# Patient Record
Sex: Male | Born: 1940 | Race: White | Hispanic: No | Marital: Married | Smoking: Never smoker
Health system: Southern US, Community
[De-identification: ages and names within clinical notes are randomized; demographics above are authoritative.]

## PROBLEM LIST (undated history)

## (undated) DIAGNOSIS — K219 Gastro-esophageal reflux disease without esophagitis: Secondary | ICD-10-CM

## (undated) DIAGNOSIS — I10 Essential (primary) hypertension: Secondary | ICD-10-CM

## (undated) DIAGNOSIS — M109 Gout, unspecified: Secondary | ICD-10-CM

## (undated) DIAGNOSIS — I2510 Atherosclerotic heart disease of native coronary artery without angina pectoris: Secondary | ICD-10-CM

## (undated) DIAGNOSIS — I4891 Unspecified atrial fibrillation: Secondary | ICD-10-CM

## (undated) HISTORY — PX: APPENDECTOMY: SHX54

## (undated) HISTORY — PX: CHOLECYSTECTOMY: SHX55

## (undated) HISTORY — DX: Atherosclerotic heart disease of native coronary artery without angina pectoris: I25.10

## (undated) HISTORY — DX: Unspecified atrial fibrillation: I48.91

## (undated) HISTORY — PX: KNEE SURGERY: SHX244

## (undated) HISTORY — PX: HERNIA REPAIR: SHX51

## (undated) HISTORY — PX: FOOT SURGERY: SHX648

---

## 2015-05-30 ENCOUNTER — Emergency Department (HOSPITAL_BASED_OUTPATIENT_CLINIC_OR_DEPARTMENT_OTHER): Payer: Medicare HMO

## 2015-05-30 ENCOUNTER — Inpatient Hospital Stay (HOSPITAL_BASED_OUTPATIENT_CLINIC_OR_DEPARTMENT_OTHER)
Admission: EM | Admit: 2015-05-30 | Discharge: 2015-06-04 | DRG: 813 | Disposition: A | Discharge status: Home Health | Payer: Medicare HMO | Attending: Internal Medicine | Admitting: Internal Medicine

## 2015-05-30 ENCOUNTER — Encounter (HOSPITAL_BASED_OUTPATIENT_CLINIC_OR_DEPARTMENT_OTHER): Payer: Self-pay | Admitting: *Deleted

## 2015-05-30 DIAGNOSIS — B999 Unspecified infectious disease: Secondary | ICD-10-CM

## 2015-05-30 DIAGNOSIS — Z880 Allergy status to penicillin: Secondary | ICD-10-CM

## 2015-05-30 DIAGNOSIS — G587 Mononeuritis multiplex: Secondary | ICD-10-CM

## 2015-05-30 DIAGNOSIS — R1084 Generalized abdominal pain: Secondary | ICD-10-CM

## 2015-05-30 DIAGNOSIS — R21 Rash and other nonspecific skin eruption: Secondary | ICD-10-CM | POA: Diagnosis present

## 2015-05-30 DIAGNOSIS — D72819 Decreased white blood cell count, unspecified: Secondary | ICD-10-CM | POA: Diagnosis present

## 2015-05-30 DIAGNOSIS — R202 Paresthesia of skin: Secondary | ICD-10-CM

## 2015-05-30 DIAGNOSIS — K219 Gastro-esophageal reflux disease without esophagitis: Secondary | ICD-10-CM | POA: Diagnosis present

## 2015-05-30 DIAGNOSIS — R51 Headache: Secondary | ICD-10-CM | POA: Diagnosis present

## 2015-05-30 DIAGNOSIS — I10 Essential (primary) hypertension: Secondary | ICD-10-CM | POA: Diagnosis present

## 2015-05-30 DIAGNOSIS — K56600 Partial intestinal obstruction, unspecified as to cause: Secondary | ICD-10-CM

## 2015-05-30 DIAGNOSIS — G4733 Obstructive sleep apnea (adult) (pediatric): Secondary | ICD-10-CM | POA: Diagnosis present

## 2015-05-30 DIAGNOSIS — I776 Arteritis, unspecified: Secondary | ICD-10-CM | POA: Diagnosis present

## 2015-05-30 DIAGNOSIS — M109 Gout, unspecified: Secondary | ICD-10-CM | POA: Diagnosis present

## 2015-05-30 DIAGNOSIS — R109 Unspecified abdominal pain: Secondary | ICD-10-CM | POA: Diagnosis not present

## 2015-05-30 DIAGNOSIS — Z79891 Long term (current) use of opiate analgesic: Secondary | ICD-10-CM

## 2015-05-30 DIAGNOSIS — D696 Thrombocytopenia, unspecified: Secondary | ICD-10-CM | POA: Diagnosis not present

## 2015-05-30 DIAGNOSIS — K5900 Constipation, unspecified: Secondary | ICD-10-CM | POA: Diagnosis not present

## 2015-05-30 DIAGNOSIS — Z79899 Other long term (current) drug therapy: Secondary | ICD-10-CM

## 2015-05-30 DIAGNOSIS — N210 Calculus in bladder: Secondary | ICD-10-CM | POA: Diagnosis present

## 2015-05-30 DIAGNOSIS — K4031 Unilateral inguinal hernia, with obstruction, without gangrene, recurrent: Secondary | ICD-10-CM | POA: Diagnosis present

## 2015-05-30 DIAGNOSIS — Z9049 Acquired absence of other specified parts of digestive tract: Secondary | ICD-10-CM | POA: Diagnosis present

## 2015-05-30 DIAGNOSIS — K760 Fatty (change of) liver, not elsewhere classified: Secondary | ICD-10-CM | POA: Diagnosis present

## 2015-05-30 DIAGNOSIS — N200 Calculus of kidney: Secondary | ICD-10-CM | POA: Diagnosis present

## 2015-05-30 DIAGNOSIS — E222 Syndrome of inappropriate secretion of antidiuretic hormone: Secondary | ICD-10-CM | POA: Diagnosis present

## 2015-05-30 DIAGNOSIS — R519 Headache, unspecified: Secondary | ICD-10-CM | POA: Insufficient documentation

## 2015-05-30 DIAGNOSIS — A7740 Ehrlichiosis, unspecified: Secondary | ICD-10-CM | POA: Diagnosis present

## 2015-05-30 HISTORY — DX: Essential (primary) hypertension: I10

## 2015-05-30 HISTORY — DX: Gastro-esophageal reflux disease without esophagitis: K21.9

## 2015-05-30 HISTORY — DX: Gout, unspecified: M10.9

## 2015-05-30 LAB — URINALYSIS, ROUTINE W REFLEX MICROSCOPIC
BILIRUBIN URINE: NEGATIVE
Glucose, UA: NEGATIVE mg/dL
HGB URINE DIPSTICK: NEGATIVE
Ketones, ur: NEGATIVE mg/dL
Leukocytes, UA: NEGATIVE
Nitrite: NEGATIVE
PROTEIN: NEGATIVE mg/dL
SPECIFIC GRAVITY, URINE: 1.029 (ref 1.005–1.030)
UROBILINOGEN UA: 1 mg/dL (ref 0.0–1.0)
pH: 5.5 (ref 5.0–8.0)

## 2015-05-30 LAB — COMPREHENSIVE METABOLIC PANEL
ALT: 54 U/L (ref 17–63)
ANION GAP: 9 (ref 5–15)
AST: 70 U/L — ABNORMAL HIGH (ref 15–41)
Albumin: 3.3 g/dL — ABNORMAL LOW (ref 3.5–5.0)
Alkaline Phosphatase: 88 U/L (ref 38–126)
BILIRUBIN TOTAL: 1.2 mg/dL (ref 0.3–1.2)
BUN: 19 mg/dL (ref 6–20)
CHLORIDE: 99 mmol/L — AB (ref 101–111)
CO2: 28 mmol/L (ref 22–32)
Calcium: 9.1 mg/dL (ref 8.9–10.3)
Creatinine, Ser: 1.14 mg/dL (ref 0.61–1.24)
GFR calc Af Amer: >60 mL/min (ref >60)
GLUCOSE: 106 mg/dL — AB (ref 65–99)
Potassium: 3.9 mmol/L (ref 3.5–5.1)
Sodium: 136 mmol/L (ref 135–145)
TOTAL PROTEIN: 6.5 g/dL (ref 6.5–8.1)

## 2015-05-30 LAB — CBC WITH DIFFERENTIAL/PLATELET
Band Neutrophils: 10 % (ref 0–10)
Basophils Absolute: 0 10*3/uL (ref 0.0–0.1)
Basophils Relative: 0 % (ref 0–1)
Eosinophils Absolute: 0 10*3/uL (ref 0.0–0.7)
Eosinophils Relative: 1 % (ref 0–5)
HCT: 43.2 % (ref 39.0–52.0)
Hemoglobin: 14.8 g/dL (ref 13.0–17.0)
LYMPHS ABS: 0.5 10*3/uL — AB (ref 0.7–4.0)
Lymphocytes Relative: 12 % (ref 12–46)
MCH: 30.3 pg (ref 26.0–34.0)
MCHC: 34.3 g/dL (ref 30.0–36.0)
MCV: 88.3 fL (ref 78.0–100.0)
Metamyelocytes Relative: 1 %
Monocytes Absolute: 0.4 10*3/uL (ref 0.1–1.0)
Monocytes Relative: 9 % (ref 3–12)
Myelocytes: 1 %
Neutro Abs: 3 10*3/uL (ref 1.7–7.7)
Neutrophils Relative %: 66 % (ref 43–77)
PLATELETS: 82 10*3/uL — AB (ref 150–400)
RBC: 4.89 MIL/uL (ref 4.22–5.81)
RDW: 13.1 % (ref 11.5–15.5)
WBC: 3.9 10*3/uL — ABNORMAL LOW (ref 4.0–10.5)

## 2015-05-30 LAB — D-DIMER, QUANTITATIVE: D-Dimer, Quant: 12.57 ug/mL-FEU — ABNORMAL HIGH (ref 0.00–0.48)

## 2015-05-30 LAB — LACTATE DEHYDROGENASE: LDH: 304 U/L — ABNORMAL HIGH (ref 98–192)

## 2015-05-30 LAB — I-STAT CHEM 8, ED
BUN: 23 mg/dL — AB (ref 6–20)
CREATININE: 1.1 mg/dL (ref 0.61–1.24)
Calcium, Ion: 1.14 mmol/L (ref 1.13–1.30)
Chloride: 96 mmol/L — ABNORMAL LOW (ref 101–111)
GLUCOSE: 105 mg/dL — AB (ref 65–99)
HEMATOCRIT: 46 % (ref 39.0–52.0)
Hemoglobin: 15.6 g/dL (ref 13.0–17.0)
Potassium: 3.8 mmol/L (ref 3.5–5.1)
Sodium: 136 mmol/L (ref 135–145)
TCO2: 24 mmol/L (ref 0–100)

## 2015-05-30 LAB — LIPASE, BLOOD: Lipase: 13 U/L — ABNORMAL LOW (ref 22–51)

## 2015-05-30 LAB — FIBRINOGEN: FIBRINOGEN: 426 mg/dL (ref 204–475)

## 2015-05-30 LAB — PROTIME-INR
INR: 1.18 (ref 0.00–1.49)
Prothrombin Time: 15.2 seconds (ref 11.6–15.2)

## 2015-05-30 LAB — ACETAMINOPHEN LEVEL

## 2015-05-30 LAB — TROPONIN I: TROPONIN I: 0.03 ng/mL (ref <0.031)

## 2015-05-30 LAB — I-STAT CG4 LACTIC ACID, ED: Lactic Acid, Venous: 1.4 mmol/L (ref 0.5–2.0)

## 2015-05-30 MED ORDER — ACETAMINOPHEN-CODEINE #3 300-30 MG PO TABS
1.0000 | ORAL_TABLET | Freq: Four times a day (QID) | ORAL | Status: DC | PRN
  Administered 2015-05-30 – 2015-06-01 (×7): 1 via ORAL
  Filled 2015-05-30 (×7): qty 1

## 2015-05-30 MED ORDER — IOHEXOL 300 MG/ML  SOLN
25.0000 mL | Freq: Once | INTRAMUSCULAR | Status: AC | PRN
  Administered 2015-05-30: 25 mL via ORAL

## 2015-05-30 MED ORDER — IOHEXOL 300 MG/ML  SOLN
100.0000 mL | Freq: Once | INTRAMUSCULAR | Status: AC | PRN
  Administered 2015-05-30: 100 mL via INTRAVENOUS

## 2015-05-30 MED ORDER — ACETAMINOPHEN 325 MG PO TABS
650.0000 mg | ORAL_TABLET | Freq: Four times a day (QID) | ORAL | Status: DC | PRN
  Administered 2015-05-30 – 2015-05-31 (×5): 650 mg via ORAL
  Filled 2015-05-30 (×5): qty 2

## 2015-05-30 MED ORDER — DOXYCYCLINE HYCLATE 100 MG PO TABS
100.0000 mg | ORAL_TABLET | Freq: Two times a day (BID) | ORAL | Status: DC
  Administered 2015-05-30 – 2015-05-31 (×2): 100 mg via ORAL
  Filled 2015-05-30 (×3): qty 1

## 2015-05-30 MED ORDER — TAMSULOSIN HCL 0.4 MG PO CAPS
0.4000 mg | ORAL_CAPSULE | Freq: Two times a day (BID) | ORAL | Status: DC
  Administered 2015-05-30 – 2015-06-04 (×10): 0.4 mg via ORAL
  Filled 2015-05-30 (×11): qty 1

## 2015-05-30 MED ORDER — SODIUM CHLORIDE 0.9 % IV SOLN: INTRAVENOUS | Status: DC

## 2015-05-30 MED ORDER — PANTOPRAZOLE SODIUM 40 MG PO TBEC
40.0000 mg | DELAYED_RELEASE_TABLET | Freq: Every day | ORAL | Status: DC
  Administered 2015-05-30 – 2015-06-04 (×6): 40 mg via ORAL
  Filled 2015-05-30 (×6): qty 1

## 2015-05-30 MED ORDER — ACETAMINOPHEN-CODEINE #3 300-30 MG PO TABS
1.0000 | ORAL_TABLET | Freq: Once | ORAL | Status: AC
  Administered 2015-05-30: 1 via ORAL
  Filled 2015-05-30: qty 1

## 2015-05-30 MED ORDER — SENNA 8.6 MG PO TABS
2.0000 | ORAL_TABLET | Freq: Every day | ORAL | Status: DC
  Administered 2015-05-30 – 2015-06-02 (×3): 17.2 mg via ORAL
  Filled 2015-05-30 (×3): qty 2

## 2015-05-30 MED ORDER — ACETAMINOPHEN 650 MG RE SUPP: 650.0000 mg | Freq: Four times a day (QID) | RECTAL | Status: DC | PRN

## 2015-05-30 MED ORDER — SODIUM CHLORIDE 0.9 % IV SOLN
INTRAVENOUS | Status: AC
  Administered 2015-05-30: 18:00:00 via INTRAVENOUS

## 2015-05-30 NOTE — ED Provider Notes (Signed)
CSN: [id number]     Arrival date & time 05/30/15  1012 History   First MD Initiated Contact with Patient 05/30/15 1026     Chief Complaint  Patient presents with  . Abdominal Pain     (Consider location/radiation/quality/duration/timing/severity/associated sxs/prior Treatment) HPI Patient presents with multiple complaints. Patient seemingly was well until about one week ago. About the time he developed abdominal pain, changes in bowel movement habits, and intermittent sharp shooting pains per Abdominal pain has been persistent, waxing waning severity. There is associated inconsistent bowel movements, with alternating diarrhea, constipation. The discomfort is largely in the lower abdomen, with associated pressure sensation. No bloody stool. Patient has no dysuria, but has oliguria. No fever. Patient describes persistent occasional sharp shooting pains in an ascending fashion, from his lower extremities superiorly. No clear precipitant for these pains, and during the initial interview he has several episodes. Today the patient also rash, bilateral lower extremities, petechial. Past Medical History  Diagnosis Date  . Hypertension   . Gout   . GERD (gastroesophageal reflux disease)    Past Surgical History  Procedure Laterality Date  . Cholecystectomy    . Appendectomy    . Hernia repair    . Foot surgery    . Knee surgery     No family history on file. History  Substance Use Topics  . Smoking status: Never Smoker   . Smokeless tobacco: Not on file  . Alcohol Use: No    Review of Systems  Constitutional:       Per HPI, otherwise negative  HENT:       Per HPI, otherwise negative  Respiratory:       Per HPI, otherwise negative  Cardiovascular:       Per HPI, otherwise negative  Gastrointestinal: Negative for vomiting.  Endocrine:       Negative aside from HPI  Genitourinary:       Neg aside from HPI   Musculoskeletal:       Per HPI, otherwise negative  Skin:  Positive for rash.  Neurological: Negative for syncope.      Allergies  Penicillins  Home Medications   Prior to Admission medications   Medication Sig Start Date End Date Taking? Authorizing Provider  indomethacin (INDOCIN) 50 MG capsule Take 50 mg by mouth 2 (two) times daily with a meal.   Yes Historical Provider, MD  omeprazole (PRILOSEC) 20 MG capsule Take 20 mg by mouth daily.   Yes Historical Provider, MD  tamsulosin (FLOMAX) 0.4 MG CAPS capsule Take 0.4 mg by mouth.   Yes Historical Provider, MD   BP 149/102 mmHg  Pulse 88  Temp(Src) 98.6 F (37 C) (Oral)  Resp 18  Ht 6\' 1"  (1.854 m)  Wt 212 lb (96.163 kg)  BMI 27.98 kg/m2  SpO2 99% Physical Exam  Constitutional: He is oriented to person, place, and time. He appears well-developed. No distress.  HENT:  Head: Normocephalic and atraumatic.  Eyes: Conjunctivae and EOM are normal.  Cardiovascular: Normal rate and regular rhythm.   Pulmonary/Chest: Effort normal. No stridor. No respiratory distress.  Abdominal: He exhibits no distension.  Diffusely tender abdomen, mild guarding  Just superior to the umbilicus there is a less than 1 cm red area consistent with ingrown hair  Musculoskeletal: He exhibits no edema.  Neurological: He is alert and oriented to person, place, and time. He displays no atrophy and no tremor. No cranial nerve deficit or sensory deficit. He exhibits normal muscle tone. He displays  no seizure activity.  Speech is clear, but somewhat repetitive, tangential.   Skin: Skin is warm and dry. Rash noted.  Bilateral petechial rash about both calves.  Psychiatric: He has a normal mood and affect.  Nursing note and vitals reviewed.   ED Course  Procedures (including critical care time) Labs Review Labs Reviewed  COMPREHENSIVE METABOLIC PANEL - Abnormal; Notable for the following:    Chloride 99 (*)    Glucose, Bld 106 (*)    Albumin 3.3 (*)    AST 70 (*)    All other components within normal  limits  CBC WITH DIFFERENTIAL/PLATELET - Abnormal; Notable for the following:    WBC 3.9 (*)    Platelets 82 (*)    Lymphs Abs 0.5 (*)    All other components within normal limits  URINALYSIS, ROUTINE W REFLEX MICROSCOPIC (NOT AT [facility]) - Abnormal; Notable for the following:    Color, Urine AMBER (*)    APPearance HAZY (*)    All other components within normal limits  D-DIMER, QUANTITATIVE (NOT AT [facility]) - Abnormal; Notable for the following:    D-Dimer, Quant 12.57 (*)    All other components within normal limits  LACTATE DEHYDROGENASE - Abnormal; Notable for the following:    LDH 304 (*)    All other components within normal limits  I-STAT CHEM 8, ED - Abnormal; Notable for the following:    Chloride 96 (*)    BUN 23 (*)    Glucose, Bld 105 (*)    All other components within normal limits  TROPONIN I  PROTIME-INR  FIBRINOGEN  LIPASE, BLOOD  URINE MICROSCOPIC-ADD ON  SAVE SMEAR  I-STAT CG4 LACTIC ACID, ED    Imaging Review Ct Abdomen Pelvis W Contrast  05/30/2015   CLINICAL DATA:  Left lower quadrant pain, fever starting yesterday, status post appendectomy, cholecystectomy, hernia repair.  EXAM: CT ABDOMEN AND PELVIS WITH CONTRAST  TECHNIQUE: Multidetector CT imaging of the abdomen and pelvis was performed using the standard protocol following bolus administration of intravenous contrast.  CONTRAST:  25mL OMNIPAQUE IOHEXOL 300 MG/ML SOLN, OMNIPAQUE IOHEXOL 300 MG/ML SOLN  COMPARISON:  None.  FINDINGS: Lung bases are unremarkable. Atherosclerotic calcifications of abdominal aorta. Degenerative changes of the lumbar spine. There is dextroscoliosis of the lumbar spine.  Fatty infiltration of the liver is noted. Partially fatty replaced atrophic pancreas. The spleen and adrenal glands are unremarkable. Kidneys are symmetrical in size. There is no hydronephrosis or hydroureter. At least 2 nonobstructive calcified calculi are noted within left kidney the largest in lower pole measures  7.7 mm.  Delayed renal images shows bilateral renal symmetrical excretion.  No calcified ureteral calculi are noted. In axial image 72 there is a calcified calculus in right posterior bladder wall measures 4.8 mm. This is probable within bladder or just beyond the right UPJ. No dilatation of distal right ureter. Mild enlarged prostate gland with indentation of urinary bladder base. There is a distended urinary bladder.  Moderate stool noted in right colon and transverse colon. There is minimal distended terminal small bowel with fecal like material see axial image 58. In axial image 72 there is a small right inguinal scrotal canal hernia containing fat and small bowel loop measures 2.3 cm. There is no definite evidence of small bowel obstruction at the level of hernia. However given the fecal like material within terminal small bowel partial small bowel obstruction or incompetent ileocecal valve cannot be excluded. No small bowel air-fluid levels are noted.  There is no transition point in caliber of small bone. Seminal vesicles are unremarkable.  Sigmoid colon diverticula are noted. No evidence of acute colitis or diverticulitis  IMPRESSION: 1. Fatty infiltration of the liver.  Status postcholecystectomy. 2. Atrophic partially fatty replaced pancreas. 3. 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 No hydronephrosis or hydroureter. 4. There is probable calcified calculus in right posterior bladder wall probable just beyond the right UVJ measures 4.8 mm without evidence of ureteral obstruction. 5. There is a small right inguinal scrotal canal hernia containing fat and short segment of small bowel loop. Minimal distended terminal small bowel with fecal like material. Partial small bowel obstruction, ileus or incompetent ileal cecal valve cannot be excluded. Clinical correlation is necessary. No small bowel air-fluid levels. There is no evidence of transition point in caliber of small bowel. 6. Enlarged prostate gland  with indentation of urinary bladder wall. Moderate distended urinary bladder.   Electronically Signed   By: Natasha Mead M.D.   On: 05/30/2015 12:24    Patient's labs notable for from cytopenia, elevated d-dimer, elevated lactate dehydrogenase, all consistent with inappropriate clotting/lysis processes.  CT scan suggestive of bowel obstruction, though the patient has no vomiting, this may be the case.   EKG Interpretation  Date/Time:  Monday May 30 2015 14:34:48 EDT Ventricular Rate:  86 PR Interval:  182 QRS Duration: 102 QT Interval:  362 QTC Calculation: 433 R Axis:   85 Text Interpretation:  Normal sinus rhythm Normal ECG Sinus rhythm Normal ECG Confirmed by Gerhard Munch  MD ([id number]) on 05/30/2015 2:45:55 PM      Attempts to discuss the patient's case with surgery were unsuccessful.  I discussed patient's case with our admitting hospitalist team. Given the patient's lab results consistent with hemolytic process, ongoing headache, pain, he will be transferred, admitted.    Cardiac 85 sinus normal Pulse ox 99% room air normal  Reviewed the patient's labs from other facility, 4 days ago. Patient had a normal head CT, platelets were 100 at that point.    MDM  Patient presents with ongoing abdominal pain, intermittent diffuse episodic pain, change in bowel movements. Patient's description of change from baseline is atypical, and given his new petechial lesions, there was concern for hemolytic process. Patient's labs consistent with this, possibly TTP versus other microangiopathic hemolytic process. Patient received fluid resuscitation here, analgesia here, and after discussion with hospitalist, patient required admission.  Gerhard Munch, MD 05/30/15 [id number]

## 2015-05-30 NOTE — ED Notes (Signed)
Patient transported to CT 

## 2015-05-30 NOTE — ED Notes (Signed)
Notified [facility] of room 1523

## 2015-05-30 NOTE — ED Notes (Signed)
Returned from imaging; NAD.

## 2015-05-30 NOTE — ED Notes (Signed)
PA-C student at bedside. 

## 2015-05-30 NOTE — H&P (Addendum)
History and Physical  CELEDONIO SORTINO ZOX:[id number] DOB: Mar 07, 1941 DOA: 05/30/2015  Referring physician: Dr. Gerhard Munch, EDP PCP: Karle Plumber, MD  Outpatient Specialists:  1. 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 He states that he was in his usual state of health until approximately 1 week ago when he started experiencing mild shocklike/pinprick like discomfort in both legs which was intermittent, lasting a couple seconds each time and recurring every 15 minutes. Over the next day or so, this migrated upwards and eventually localized to his left occipital/periauricular area with moderate to severe pain in that area. This was associated with chills but no fevers. He went to a clinic where temperature was reported as 22 (states that this is fever for him because he usually runs temperature of 45F). They apparently did not find anything significant and patient was given Flexeril for muscle spasms. He took 4 tablets of the Flexeril without significant relief. He then started taking his disabled son's Tylenol No. 3 with good effect. He continued to take this until this morning but continued to have the left sided posterior scalp/headache. He presented to the ED where he was asked to take a deep breath at which time he first noticed some left abdominal discomfort which is present only on palpation or taking deep breaths. He has intermittent diarrhea but has been constipated since yesterday morning and last BM was yesterday morning. Flatus +. Reduced urine output and urine appears dark. This morning while putting on his socks, he noted fine red rash on  his legs. He has not noticed any tick bites. He does not live in a wooded area. 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 Apart from Flexeril and Tylenol No. 3, he has not taken any new medications. He states that in the past he has not been told to have any abnormal blood counts. 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 Patient was sent to [facility] as a direct admit to the hospitalist service.   Review of Systems: All systems reviewed and apart from history of presenting illness, are negative.  Past Medical History  Diagnosis Date  . Hypertension   . Gout   . GERD (gastroesophageal reflux disease)    Past Surgical History  Procedure Laterality Date  . Cholecystectomy    . Appendectomy    . Hernia repair    . Foot surgery    . Knee surgery     Social History:  reports that he has never smoked. He does not have any smokeless tobacco history on file. He reports that he does not drink alcohol or use illicit drugs. Married. Active truck driver. Independent of activities of daily living.  Allergies  Allergen Reactions  . Penicillins Rash    History reviewed. No pertinent family history. patient was repeatedly asked about any significant family history  and he denies.  Prior to Admission medications   Medication Sig Start Date End Date Taking? Authorizing Provider  acetaminophen-codeine (TYLENOL #3) 300-30 MG per tablet Take 1-2 tablets by mouth every 4 (four) hours as needed for moderate pain.   Yes Historical Provider, MD  cyclobenzaprine (FLEXERIL) 10 MG tablet Take 10 mg by mouth. 05/27/15 06/03/15 Yes Historical Provider, MD    indomethacin (INDOCIN) 50 MG capsule Take 50 mg by mouth 2 (two) times daily with a meal.   Yes Historical Provider, MD  omeprazole (PRILOSEC) 20 MG capsule Take 20 mg by mouth daily.   Yes Historical Provider, MD  tamsulosin (FLOMAX) 0.4 MG CAPS capsule Take 0.4 mg by mouth 2 (two) times daily.    Yes Historical Provider, MD   Physical Exam: Filed Vitals:   05/30/15 1022 05/30/15 1355 05/30/15 1511 05/30/15 1626  BP: 149/102 174/97 150/83 130/61  Pulse: 88 93 84 94  Temp: 98.6 F (37 C)     TempSrc: Oral     Resp: 18 18 16 16   Height: 6\' 1"  (1.854 m)     Weight: 96.163 kg (212 lb)     SpO2: 99% 96% 97% 97%     General exam: Moderately built and nourished pleasant elderly male patient, lying comfortably supine on the gurney in no obvious distress.  Head, eyes and ENT: Nontraumatic and normocephalic. Pupils equally reacting to light and accommodation. Oral mucosa slightly dry.  Neck: Supple. No JVD, carotid bruit or thyromegaly.  Lymphatics: No lymphadenopathy.  Respiratory system: Clear to auscultation. No increased work of breathing.  Cardiovascular system: S1 and S2 heard, RRR. No JVD, murmurs, gallops, clicks or pedal edema.  Gastrointestinal system: Abdomen is nondistended, soft. Mild left quadrant tenderness on deep palpation without peritoneal signs. Normal bowel sounds heard. No organomegaly or masses appreciated. Small reddish spot an inch above the umbilicus of unclear etiology.  Central nervous system: Alert and oriented. No focal neurological deficits.  Extremities: Symmetric 5 x 5 power. Peripheral pulses symmetrically felt.   Skin: No rashes or acute findings. Fine petechial rash on symmetric legs mostly below the knees and to a lesser severity on the medial aspect of both thighs.  Musculoskeletal system: Negative exam.  Psychiatry: Pleasant and cooperative.   Labs on Admission:  Basic Metabolic Panel:  Recent Labs Lab 05/30/15 1140 05/30/15 1151  NA  136 136  K 3.9 3.8  CL 99* 96*  CO2 28  --   GLUCOSE 106* 105*  BUN 19 23*  CREATININE 1.14 1.10  CALCIUM 9.1  --    Liver Function Tests:  Recent Labs Lab 05/30/15 1140  AST 70*  ALT 54  ALKPHOS 88  BILITOT 1.2  PROT 6.5  ALBUMIN 3.3*   No results for input(s): LIPASE, AMYLASE in the last 168 hours. No results for input(s): AMMONIA in the last 168 hours. CBC:  Recent Labs Lab 05/30/15 1140 05/30/15 1151  WBC 3.9*  --   NEUTROABS 3.0  --   HGB 14.8 15.6  HCT 43.2 46.0  MCV 88.3  --   PLT 82*  --    Cardiac Enzymes:  Recent Labs Lab 05/30/15 1140  TROPONINI 0.03    BNP (last 3 results) No results for input(s): PROBNP in the last 8760 hours. CBG: No results for input(s): GLUCAP in the last 168 hours.  Radiological Exams on Admission: Ct Abdomen Pelvis W Contrast  05/30/2015   CLINICAL DATA:  Left lower quadrant pain, fever starting yesterday, status post  appendectomy, cholecystectomy, hernia repair.  EXAM: CT ABDOMEN AND PELVIS WITH CONTRAST  TECHNIQUE: Multidetector CT imaging of the abdomen and pelvis was performed using the standard protocol following bolus administration of intravenous contrast.  CONTRAST:  25mL OMNIPAQUE IOHEXOL 300 MG/ML SOLN, OMNIPAQUE IOHEXOL 300 MG/ML SOLN  COMPARISON:  None.  FINDINGS: Lung bases are unremarkable. Atherosclerotic calcifications of abdominal aorta. Degenerative changes of the lumbar spine. There is dextroscoliosis of the lumbar spine.  Fatty infiltration of the liver is noted. Partially fatty replaced atrophic pancreas. The spleen and adrenal glands are unremarkable. Kidneys are symmetrical in size. There is no hydronephrosis or hydroureter. At least 2 nonobstructive calcified calculi are noted within left kidney the largest in lower pole measures 7.7 mm.  Delayed renal images shows bilateral renal symmetrical excretion.  No calcified ureteral calculi are noted. In axial image 72 there is a calcified calculus in right  posterior bladder wall measures 4.8 mm. This is probable within bladder or just beyond the right UPJ. No dilatation of distal right ureter. Mild enlarged prostate gland with indentation of urinary bladder base. There is a distended urinary bladder.  Moderate stool noted in right colon and transverse colon. There is minimal distended terminal small bowel with fecal like material see axial image 58. In axial image 72 there is a small right inguinal scrotal canal hernia containing fat and small bowel loop measures 2.3 cm. There is no definite evidence of small bowel obstruction at the level of hernia. However given the fecal like material within terminal small bowel partial small bowel obstruction or incompetent ileocecal valve cannot be excluded. No small bowel air-fluid levels are noted. There is no transition point in caliber of small bone. Seminal vesicles are unremarkable.  Sigmoid colon diverticula are noted. No evidence of acute colitis or diverticulitis  IMPRESSION: 1. Fatty infiltration of the liver.  Status postcholecystectomy. 2. Atrophic partially fatty replaced pancreas. 3. 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 No hydronephrosis or hydroureter. 4. There is probable calcified calculus in right posterior bladder wall probable just beyond the right UVJ measures 4.8 mm without evidence of ureteral obstruction. 5. There is a small right inguinal scrotal canal hernia containing fat and short segment of small bowel loop. Minimal distended terminal small bowel with fecal like material. Partial small bowel obstruction, ileus or incompetent ileal cecal valve cannot be excluded. Clinical correlation is necessary. No small bowel air-fluid levels. There is no evidence of transition point in caliber of small bowel. 6. Enlarged prostate gland with indentation of urinary bladder wall. Moderate distended urinary bladder.   Electronically Signed   By: Natasha Mead M.D.   On: 05/30/2015 12:24    EKG: Independently  reviewed. Normal sinus rhythm without acute findings. QTC 433 ms.  Assessment/Plan Principal Problem:   Thrombocytopenia Active Problems:   Abdominal pain   Rash and nonspecific skin eruption   Hypertension   Thrombocytopenia with petechial rash - Unclear etiology. No prior lab work to no chronicity of same. As per EDP note, patient had platelet count of 100, 4 days back. - DD-infectious etiology (RMSF, Ehrlichiosis, Lyme's. Note mild leukopenia and mild transaminitis), primary blood disorder, medications versus other etiology. - Discussed with infectious disease M.D. on call: Recommended RMSF and Ehrlichia panel, Lyme Abs, acute hepatitis panel and HIV antibody screen and starting empirically on doxycycline 100 MG bid - Check peripheral smear. - May need hematology consultation in a.m.  Abdominal pain - History is conflicting. EDP note states abdominal pain of a few days'  duration. Patient however indicates that the first time he noted abdominal pain was in the ED when he was asked to take a deep breath - CT findings as above. No acute findings on abdominal exam. - Requested general surgery to evaluate.  OSA - Continue nightly CPAP  GERD - Continue PPI  Hypertension - Patient adamantly denies and states that he only has white coat hypertension. For now monitor.  Leukopenia - Peripheral smear. Follow CBC.  Headache/? Scalp pain - Unclear etiology. No focal deficits. Monitor.    DVT prophylaxis: SCDs Code Status: Full  Family Communication: None at bedside  Disposition Plan: DC home when medically stable.   Time spent: 70 minutes  Dewon Mendizabal, MD, FACP, FHM. [facility] Pager [phone]  If 7PM-7AM, please contact night-coverage [email or web address] Password [email or web address] 05/30/2015, 5:55 PM

## 2015-05-30 NOTE — ED Notes (Signed)
Report given to [facility]. 

## 2015-05-30 NOTE — ED Notes (Signed)
MD at bedside. 

## 2015-05-30 NOTE — ED Notes (Signed)
[facility] at bedside preparing to transport.

## 2015-05-30 NOTE — ED Notes (Signed)
Plan of care discussed with pt and spouse, directions to [facility] provided to spouse.

## 2015-05-30 NOTE — Progress Notes (Addendum)
Pt refused cpap tonight, stating that he has a bad headache.  Per pt, RN is aware of his pain.  Pt was advised that RT is available all night and encouraged him to call should he change his mind.

## 2015-05-30 NOTE — Consult Note (Signed)
General Surgery [facility]  Reason for Consult: abdominal pain, change in bowel habits  Referring Physician: Dr. Algis Liming, [facility]  CLARANCE BOLLARD is an 74 y.o. male.  HPI: patient is a 74 yo WM admitted with multiple complaints.  Patient notes "electrical" type pain which began in feet and moved cephalad, now involving left face and ear.  Patient also notes LLQ abdominal pain upon taking a deep breath.  Patient notes change in bowel habits alternating between diarrhea and constipation.  Last BM yesterday AM.  CT abdomen today shows moderate stool in right colon and fecalization of terminal ileum.  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  No acute findings on CTA.  Past Medical History  Diagnosis Date  . Hypertension   . Gout   . GERD (gastroesophageal reflux disease)     Past Surgical History  Procedure Laterality Date  . Cholecystectomy    . Appendectomy    . Hernia repair    . Foot surgery    . Knee surgery      History reviewed. No pertinent family history.  Social History:  reports that he has never smoked. He does not have any smokeless tobacco history on file. He reports that he does not drink alcohol or use illicit drugs.  Allergies:  Allergies  Allergen Reactions  . Penicillins Rash    Medications: I have reviewed the patient's current medications.  Results for orders placed or performed during the hospital encounter of 05/30/15 (from the past 48 hour(s))  Comprehensive metabolic panel     Status: Abnormal   Collection Time: 05/30/15 11:40 AM  Result Value Ref Range   Sodium 136 135 - 145 mmol/L   Potassium 3.9 3.5 - 5.1 mmol/L   Chloride 99 (L) 101 - 111 mmol/L   CO2 28 22 - 32 mmol/L   Glucose, Bld 106 (H) 65 - 99 mg/dL   BUN 19 6 - 20 mg/dL   Creatinine, Ser 1.14 0.61 - 1.24 mg/dL   Calcium 9.1 8.9 - 10.3 mg/dL   Total Protein 6.5 6.5 - 8.1 g/dL   Albumin 3.3 (L) 3.5 - 5.0 g/dL    AST 70 (H) 15 - 41 U/L   ALT 54 17 - 63 U/L   Alkaline Phosphatase 88 38 - 126 U/L   Total Bilirubin 1.2 0.3 - 1.2 mg/dL   GFR calc non Af Amer >60 >60 mL/min   GFR calc Af Amer >60 >60 mL/min    Comment: (NOTE) The eGFR has been calculated using the CKD EPI equation. This calculation has not been validated in all clinical situations. eGFR's persistently <60 mL/min signify possible Chronic Kidney Disease.    Anion gap 9 5 - 15    Comment: Performed at [facility]  Troponin I     Status: None   Collection Time: 05/30/15 11:40 AM  Result Value Ref Range   Troponin I 0.03 <0.031 ng/mL    Comment:        NO INDICATION OF MYOCARDIAL INJURY.   CBC with Differential     Status: Abnormal   Collection Time: 05/30/15 11:40 AM  Result Value Ref Range   WBC 3.9 (L) 4.0 - 10.5 K/uL   RBC 4.89 4.22 - 5.81 MIL/uL   Hemoglobin 14.8 13.0 - 17.0 g/dL   HCT 43.2 39.0 - 52.0 %   MCV 88.3 78.0 - 100.0 fL   MCH 30.3 26.0 - 34.0 pg  MCHC 34.3 30.0 - 36.0 g/dL   RDW 13.1 11.5 - 15.5 %   Platelets 82 (L) 150 - 400 K/uL    Comment: PLATELET COUNT CONFIRMED BY SMEAR SPECIMEN CHECKED FOR CLOTS PLATELETS APPEAR DECREASED    Neutrophils Relative % 66 43 - 77 %   Lymphocytes Relative 12 12 - 46 %   Monocytes Relative 9 3 - 12 %   Eosinophils Relative 1 0 - 5 %   Basophils Relative 0 0 - 1 %   Band Neutrophils 10 0 - 10 %   Metamyelocytes Relative 1 %   Myelocytes 1 %   Neutro Abs 3.0 1.7 - 7.7 K/uL   Lymphs Abs 0.5 (L) 0.7 - 4.0 K/uL   Monocytes Absolute 0.4 0.1 - 1.0 K/uL   Eosinophils Absolute 0.0 0.0 - 0.7 K/uL   Basophils Absolute 0.0 0.0 - 0.1 K/uL   WBC Morphology ATYPICAL LYMPHOCYTES    Smear Review LARGE PLATELETS PRESENT     Comment: SPECIMEN CHECKED FOR CLOTS GIANT PLATELETS SEEN PLATELET COUNT CONFIRMED BY SMEAR PLATELETS APPEAR DECREASED   Protime-INR     Status: None   Collection Time: 05/30/15 11:40 AM  Result Value Ref Range   Prothrombin Time 15.2 11.6 - 15.2  seconds   INR 1.18 0.00 - 1.49  D-dimer, quantitative     Status: Abnormal   Collection Time: 05/30/15 11:40 AM  Result Value Ref Range   D-Dimer, Quant 12.57 (H) 0.00 - 0.48 ug/mL-FEU    Comment:        AT THE INHOUSE ESTABLISHED CUTOFF VALUE OF 0.48 ug/mL FEU, THIS ASSAY HAS BEEN DOCUMENTED IN THE LITERATURE TO HAVE A SENSITIVITY AND NEGATIVE PREDICTIVE VALUE OF AT LEAST 98 TO 99%.  THE TEST RESULT SHOULD BE CORRELATED WITH AN ASSESSMENT OF THE CLINICAL PROBABILITY OF DVT / VTE.   Fibrinogen     Status: None   Collection Time: 05/30/15 11:40 AM  Result Value Ref Range   Fibrinogen 426 204 - 475 mg/dL  Lactate dehydrogenase     Status: Abnormal   Collection Time: 05/30/15 11:40 AM  Result Value Ref Range   LDH 304 (H) 98 - 192 U/L    Comment: Performed at [facility]  Urinalysis, Routine w reflex microscopic (not at [facility])     Status: Abnormal   Collection Time: 05/30/15 11:45 AM  Result Value Ref Range   Color, Urine AMBER (A) YELLOW    Comment: BIOCHEMICALS MAY BE AFFECTED BY COLOR   APPearance HAZY (A) CLEAR   Specific Gravity, Urine 1.029 1.005 - 1.030   pH 5.5 5.0 - 8.0   Glucose, UA NEGATIVE NEGATIVE mg/dL   Hgb urine dipstick NEGATIVE NEGATIVE   Bilirubin Urine NEGATIVE NEGATIVE   Ketones, ur NEGATIVE NEGATIVE mg/dL   Protein, ur NEGATIVE NEGATIVE mg/dL   Urobilinogen, UA 1.0 0.0 - 1.0 mg/dL   Nitrite NEGATIVE NEGATIVE   Leukocytes, UA NEGATIVE NEGATIVE    Comment: MICROSCOPIC NOT DONE ON URINES WITH NEGATIVE PROTEIN, BLOOD, LEUKOCYTES, NITRITE, OR GLUCOSE <1000 mg/dL.  I-stat Chem 8, ED (when main lab is down)     Status: Abnormal   Collection Time: 05/30/15 11:51 AM  Result Value Ref Range   Sodium 136 135 - 145 mmol/L   Potassium 3.8 3.5 - 5.1 mmol/L   Chloride 96 (L) 101 - 111 mmol/L   BUN 23 (H) 6 - 20 mg/dL   Creatinine, Ser 1.10 0.61 - 1.24 mg/dL   Glucose, Bld 105 (H)  65 - 99 mg/dL   Calcium, Ion 1.14 1.13 - 1.30 mmol/L   TCO2 24 0 - 100  mmol/L   Hemoglobin 15.6 13.0 - 17.0 g/dL   HCT 46.0 39.0 - 52.0 %  I-Stat CG4 Lactic Acid, ED     Status: None   Collection Time: 05/30/15 11:51 AM  Result Value Ref Range   Lactic Acid, Venous 1.40 0.5 - 2.0 mmol/L  Acetaminophen level     Status: Abnormal   Collection Time: 05/30/15  7:40 PM  Result Value Ref Range   Acetaminophen (Tylenol), Serum <10 (L) 10 - 30 ug/mL    Comment:        THERAPEUTIC CONCENTRATIONS VARY SIGNIFICANTLY. A RANGE OF 10-30 ug/mL MAY BE AN EFFECTIVE CONCENTRATION FOR MANY PATIENTS. HOWEVER, SOME ARE BEST TREATED AT CONCENTRATIONS OUTSIDE THIS RANGE. ACETAMINOPHEN CONCENTRATIONS >150 ug/mL AT 4 HOURS AFTER INGESTION AND >50 ug/mL AT 12 HOURS AFTER INGESTION ARE OFTEN ASSOCIATED WITH TOXIC REACTIONS.     Ct Abdomen Pelvis W Contrast  05/30/2015   CLINICAL DATA:  Left lower quadrant pain, fever starting yesterday, status post appendectomy, cholecystectomy, hernia repair.  EXAM: CT ABDOMEN AND PELVIS WITH CONTRAST  TECHNIQUE: Multidetector CT imaging of the abdomen and pelvis was performed using the standard protocol following bolus administration of intravenous contrast.  CONTRAST:  104mL OMNIPAQUE IOHEXOL 300 MG/ML SOLN, 127mL OMNIPAQUE IOHEXOL 300 MG/ML SOLN  COMPARISON:  None.  FINDINGS: Lung bases are unremarkable. Atherosclerotic calcifications of abdominal aorta. Degenerative changes of the lumbar spine. There is dextroscoliosis of the lumbar spine.  Fatty infiltration of the liver is noted. Partially fatty replaced atrophic pancreas. The spleen and adrenal glands are unremarkable. Kidneys are symmetrical in size. There is no hydronephrosis or hydroureter. At least 2 nonobstructive calcified calculi are noted within left kidney the largest in lower pole measures 7.7 mm.  Delayed renal images shows bilateral renal symmetrical excretion.  No calcified ureteral calculi are noted. In axial image 72 there is a calcified calculus in right posterior bladder wall  measures 4.8 mm. This is probable within bladder or just beyond the right UPJ. No dilatation of distal right ureter. Mild enlarged prostate gland with indentation of urinary bladder base. There is a distended urinary bladder.  Moderate stool noted in right colon and transverse colon. There is minimal distended terminal small bowel with fecal like material see axial image 58. In axial image 72 there is a small right inguinal scrotal canal hernia containing fat and small bowel loop measures 2.3 cm. There is no definite evidence of small bowel obstruction at the level of hernia. However given the fecal like material within terminal small bowel partial small bowel obstruction or incompetent ileocecal valve cannot be excluded. No small bowel air-fluid levels are noted. There is no transition point in caliber of small bone. Seminal vesicles are unremarkable.  Sigmoid colon diverticula are noted. No evidence of acute colitis or diverticulitis  IMPRESSION: 1. Fatty infiltration of the liver.  Status postcholecystectomy. 2. Atrophic partially fatty replaced pancreas. 3. 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 No hydronephrosis or hydroureter. 4. There is probable calcified calculus in right posterior bladder wall probable just beyond the right UVJ measures 4.8 mm without evidence of ureteral obstruction. 5. There is a small right inguinal scrotal canal hernia containing fat and short segment of small bowel loop. Minimal distended terminal small bowel with fecal like material. Partial small bowel obstruction, ileus or incompetent ileal cecal valve cannot be excluded. Clinical correlation is necessary. No small  bowel air-fluid levels. There is no evidence of transition point in caliber of small bowel. 6. Enlarged prostate gland with indentation of urinary bladder wall. Moderate distended urinary bladder.   Electronically Signed   By: Lahoma Crocker M.D.   On: 05/30/2015 12:24    Review of Systems  Constitutional:  Negative.   HENT: Positive for ear pain.   Eyes: Negative.   Respiratory:       Pain with deep breath  Cardiovascular: Negative.   Gastrointestinal: Positive for abdominal pain (left lower quadrant), diarrhea and constipation.  Genitourinary: Negative.   Musculoskeletal: Negative.   Skin: Positive for rash.  Neurological: Positive for tingling.  Endo/Heme/Allergies: Negative.   Psychiatric/Behavioral: Negative.    Blood pressure 130/61, pulse 94, temperature 98.6 F (37 C), temperature source Oral, resp. rate 16, height $RemoveBe'6\' 1"'[id number]$  (1.854 m), weight 96.163 kg (212 lb), SpO2 97 %. Physical Exam  Constitutional: He is oriented to person, place, and time. He appears well-developed and well-nourished. No distress.  HENT:  Head: Normocephalic and atraumatic.  Right Ear: External ear normal.  Left Ear: External ear normal.  Dentition poor  Eyes: Conjunctivae are normal. Pupils are equal, round, and reactive to light. No scleral icterus.  Neck: Normal range of motion. Neck supple. No tracheal deviation present. No thyromegaly present.  Cardiovascular: Normal rate, regular rhythm and normal heart sounds.   Respiratory: Effort normal and breath sounds normal. No respiratory distress. He has no wheezes.  GI: Soft. Bowel sounds are normal. He exhibits no distension and no mass. There is no tenderness. There is no rebound and no guarding.  Genitourinary: Penis normal.  Small right inguinal hernia, spontaneously reducible; well-healed bilateral inguinal hernia repair incisions  Musculoskeletal: Normal range of motion. He exhibits no edema or tenderness.  Lymphadenopathy:    He has no cervical adenopathy.  Neurological: He is alert and oriented to person, place, and time.  Skin: Skin is warm and dry. He is not diaphoretic.  Psychiatric: He has a normal mood and affect. His behavior is normal.    Assessment/Plan: 1. Abdominal pain, LLQ, of unknown etiology 2. Partial SBO versus constipation 3.  Recurrent RIH  Patient is seen and examined.  CT scan also reviewed and discussed with patient.  No evidence of acute intra-abdominal process.  Medical evaluation ongoing.  Will follow with you.  Plan to obtain AXR in AM to check progress of CT contrast.  Earnstine Regal, MD, [facility] Office: [place] 05/30/2015, 9:05 PM

## 2015-05-30 NOTE — ED Notes (Signed)
PA-C student at bedside discussing plan of care and admission.

## 2015-05-30 NOTE — ED Notes (Signed)
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 Pt sts his temp usually runs 96 degrees so a temp of 98 is high for him. Pt sts he is also feeling a pain in his LLQ. Pt was seen at [facility] and got an rx for flexeril but no antibiotics.

## 2015-05-30 NOTE — ED Notes (Signed)
Urinal provided and pt is drinking PO contrast.

## 2015-05-30 NOTE — Progress Notes (Signed)
Mr Leroy Morrison, Leroy Morrison (dob [birth year]) presented to [facility] due to abdominal pain, new petechial rash bilateral lower extremities. Labs showed thrombocytopenia, elevated ldh, ct ab ? Partial SBO, patient denies n/v. Patient is hemodynamically stable. He was seen at [facility] on 7/29 due to muscle aches and soreness was given muscle relaxer.  Broad differential including viral illness, tick born disease, hematological /rheumatological process, likely will need hematology/genernal surgery to biopsy rash. Patient accepted to med surg at [facility].

## 2015-05-30 NOTE — ED Notes (Signed)
Report called to Darl Pikes, RN [facility]

## 2015-05-31 ENCOUNTER — Observation Stay (HOSPITAL_COMMUNITY): Payer: Medicare HMO

## 2015-05-31 ENCOUNTER — Encounter (HOSPITAL_COMMUNITY): Payer: Self-pay | Admitting: Radiology

## 2015-05-31 DIAGNOSIS — R519 Headache, unspecified: Secondary | ICD-10-CM | POA: Insufficient documentation

## 2015-05-31 DIAGNOSIS — K5900 Constipation, unspecified: Secondary | ICD-10-CM

## 2015-05-31 DIAGNOSIS — R109 Unspecified abdominal pain: Secondary | ICD-10-CM | POA: Diagnosis not present

## 2015-05-31 DIAGNOSIS — D696 Thrombocytopenia, unspecified: Secondary | ICD-10-CM | POA: Diagnosis not present

## 2015-05-31 DIAGNOSIS — R21 Rash and other nonspecific skin eruption: Secondary | ICD-10-CM | POA: Diagnosis not present

## 2015-05-31 DIAGNOSIS — D72819 Decreased white blood cell count, unspecified: Secondary | ICD-10-CM | POA: Diagnosis not present

## 2015-05-31 DIAGNOSIS — R51 Headache: Secondary | ICD-10-CM | POA: Diagnosis not present

## 2015-05-31 DIAGNOSIS — M792 Neuralgia and neuritis, unspecified: Secondary | ICD-10-CM

## 2015-05-31 DIAGNOSIS — R5383 Other fatigue: Secondary | ICD-10-CM

## 2015-05-31 DIAGNOSIS — R509 Fever, unspecified: Secondary | ICD-10-CM | POA: Diagnosis not present

## 2015-05-31 LAB — CK: Total CK: 77 U/L (ref 49–397)

## 2015-05-31 LAB — COMPREHENSIVE METABOLIC PANEL
ALBUMIN: 3.2 g/dL — AB (ref 3.5–5.0)
ALK PHOS: 90 U/L (ref 38–126)
ALT: 53 U/L (ref 17–63)
AST: 55 U/L — AB (ref 15–41)
Anion gap: 8 (ref 5–15)
BILIRUBIN TOTAL: 1.4 mg/dL — AB (ref 0.3–1.2)
BUN: 19 mg/dL (ref 6–20)
CHLORIDE: 102 mmol/L (ref 101–111)
CO2: 28 mmol/L (ref 22–32)
CREATININE: 1.26 mg/dL — AB (ref 0.61–1.24)
Calcium: 8.6 mg/dL — ABNORMAL LOW (ref 8.9–10.3)
GFR calc non Af Amer: 54 mL/min — ABNORMAL LOW (ref >60)
Glucose, Bld: 101 mg/dL — ABNORMAL HIGH (ref 65–99)
Potassium: 4.3 mmol/L (ref 3.5–5.1)
Sodium: 138 mmol/L (ref 135–145)
TOTAL PROTEIN: 6.1 g/dL — AB (ref 6.5–8.1)

## 2015-05-31 LAB — DIFFERENTIAL
BASOS ABS: 0 10*3/uL (ref 0.0–0.1)
Band Neutrophils: 0 % (ref 0–10)
Basophils Relative: 1 % (ref 0–1)
Blasts: 0 %
EOS PCT: 3 % (ref 0–5)
Eosinophils Absolute: 0.1 10*3/uL (ref 0.0–0.7)
Lymphocytes Relative: 23 % (ref 12–46)
Lymphs Abs: 1.1 10*3/uL (ref 0.7–4.0)
METAMYELOCYTES PCT: 0 %
MONOS PCT: 13 % — AB (ref 3–12)
MYELOCYTES: 0 %
Monocytes Absolute: 0.6 10*3/uL (ref 0.1–1.0)
NEUTROS ABS: 2.8 10*3/uL (ref 1.7–7.7)
NEUTROS PCT: 60 % (ref 43–77)
NRBC: 0 /100{WBCs}
OTHER: 0 %
Promyelocytes Absolute: 0 %

## 2015-05-31 LAB — C-REACTIVE PROTEIN: CRP: 18.1 mg/dL — AB (ref <1.0)

## 2015-05-31 LAB — VITAMIN B12: Vitamin B-12: 628 pg/mL (ref 180–914)

## 2015-05-31 LAB — PATHOLOGIST SMEAR REVIEW

## 2015-05-31 LAB — CBC
HEMATOCRIT: 38.3 % — AB (ref 39.0–52.0)
HEMOGLOBIN: 12.7 g/dL — AB (ref 13.0–17.0)
MCH: 29.3 pg (ref 26.0–34.0)
MCHC: 33.2 g/dL (ref 30.0–36.0)
MCV: 88.5 fL (ref 78.0–100.0)
Platelets: 77 10*3/uL — ABNORMAL LOW (ref 150–400)
RBC: 4.33 MIL/uL (ref 4.22–5.81)
RDW: 13 % (ref 11.5–15.5)
WBC: 4.6 10*3/uL (ref 4.0–10.5)

## 2015-05-31 LAB — SEDIMENTATION RATE: Sed Rate: 31 mm/hr — ABNORMAL HIGH (ref 0–16)

## 2015-05-31 LAB — URIC ACID: Uric Acid, Serum: 8.4 mg/dL — ABNORMAL HIGH (ref 4.4–7.6)

## 2015-05-31 MED ORDER — DOXYCYCLINE HYCLATE 100 MG IV SOLR
100.0000 mg | Freq: Two times a day (BID) | INTRAVENOUS | Status: DC
Start: 2015-05-31 — End: 2015-06-04
  Administered 2015-05-31 – 2015-06-04 (×8): 100 mg via INTRAVENOUS
  Filled 2015-05-31 (×9): qty 100

## 2015-05-31 MED ORDER — GABAPENTIN 300 MG PO CAPS
300.0000 mg | ORAL_CAPSULE | Freq: Three times a day (TID) | ORAL | Status: DC
  Administered 2015-05-31 – 2015-06-01 (×3): 300 mg via ORAL
  Filled 2015-05-31 (×6): qty 1

## 2015-05-31 MED ORDER — POLYETHYLENE GLYCOL 3350 17 G PO PACK
17.0000 g | PACK | Freq: Every day | ORAL | Status: AC
  Filled 2015-05-31: qty 1

## 2015-05-31 MED ORDER — FLEET ENEMA 7-19 GM/118ML RE ENEM
1.0000 | ENEMA | Freq: Once | RECTAL | Status: AC
  Administered 2015-05-31: 1 via RECTAL
  Filled 2015-05-31: qty 1

## 2015-05-31 MED ORDER — SODIUM CHLORIDE 0.9 % IV SOLN
INTRAVENOUS | Status: AC
  Administered 2015-05-31 – 2015-06-01 (×2): via INTRAVENOUS

## 2015-05-31 NOTE — Progress Notes (Addendum)
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 Patient was sent to [facility] as a direct admit to the hospitalist service. Surgery evaluated and signed off-no acute abdominal issues. ID consulted for evaluation of infectious etiology as cause for his presentation.   Assessment/Plan:  Thrombocytopenia with petechial rash - Unclear etiology. No prior lab work to know chronicity of same. As per EDP note, patient had platelet count of 100, 4 days pta. - DD-infectious etiology (RMSF, Ehrlichiosis, Lyme's. Note mild leukopenia and mild transaminitis), primary blood disorder, medications versus other etiology. - Discussed with infectious disease M.D. on call on 8/1: Recommended RMSF and Ehrlichia panel, Lyme Abs, acute hepatitis panel and HIV antibody screen and starting empirically on doxycycline 100 MG bid. All labs drawn and pending - Check peripheral smear- pending. - Platelets have dropped from 82 on admission to 27. Follow CBC in a.m. Elevated d-dimer (12.57)-probably an acute phase reactant in the absence of respiratory symptoms or chest pain. Fibrinogen 426. - Creatinine has mildly bumped up.? Related to  IV contrast. Brief IV fluids. Follow BMP in a.m. - Requested ID input on 8/2.  Abdominal pain - History is conflicting. EDP note states abdominal pain of a few days' duration. Patient however indicates that the first time he noted abdominal pain was in the ED when he was asked to take a deep breath - CT findings as above. No acute findings on abdominal exam. - Surgery input appreciated-no acute surgical needs. Recommend bowel regimen for constipation and outpatient follow-up regarding recurrent right inguinal hernia.  OSA - Continue nightly CPAP  GERD - Continue PPI  Hypertension - Patient adamantly denies and states that he only has white coat hypertension. For now monitor.  Leukopenia - Resolved  Headache/? Scalp pain - Unclear etiology. No focal deficits. Localized headache around left year/left occipital scalp. No acute findings on otoscopic exam bilaterally. - CT head: No acute findings - No meningismus. Supportive treatment.  Constipation - Bowel regimen.   DVT prophylaxis: SCDs Code Status: Full  Family Communication: None at bedside  Disposition Plan: DC home when medically stable.    Consultants:  None  Procedures:  None  Antibiotics:  PO Doxycycline 8/1 >  Subjective: Continues to complain of intermittent pain at left occipital scalp & around left ear.  Objective: Filed Vitals:   05/30/15 1626 05/30/15 2152 05/31/15 0509 05/31/15 1336  BP: 130/61 119/76 126/75 146/89  Pulse: 94 88 90 88  Temp:  98.5 F (36.9 C) 98 F (36.7 C) 98.4 F (36.9 C)  TempSrc:  Oral Oral Oral  Resp: 16 18 20 18   Height:      Weight:      SpO2: 97% 97% 100% 98%    Intake/Output Summary (Last 24 hours) at 05/31/15 1517 Last  data filed at 05/31/15 1337  Gross per 24 hour  Intake 835.83 ml  Output   2675 ml  Net -1839.17 ml   Filed Weights   05/30/15 1022  Weight: 96.163 kg (212 lb)     Exam:  General exam: Pleasant elderly male sitting up comfortably  in bed. Not septic or toxic looking. No neck stiffness. Neck supple. Respiratory system: Clear. No increased work of breathing. Cardiovascular system: S1 & S2 heard, RRR. No JVD, murmurs, gallops, clicks or pedal edema. Gastrointestinal system: Abdomen is nondistended, soft and nontender. Normal bowel sounds heard. Central nervous system: Alert and oriented. No focal neurological deficits. Extremities: Symmetric 5 x 5 power. Skin: Fine petechial rash on symmetric legs mostly below the knees and to a lesser severity on the medial aspect of both thighs.      Data Reviewed: Basic Metabolic Panel:  Recent Labs Lab 05/30/15 1140 05/30/15 1151 05/31/15 0440  NA 136 136 138  K 3.9 3.8 4.3  CL 99* 96* 102  CO2 28  --  28  GLUCOSE 106* 105* 101*  BUN 19 23* 19  CREATININE 1.14 1.10 1.26*  CALCIUM 9.1  --  8.6*   Liver Function Tests:  Recent Labs Lab 05/30/15 1140 05/31/15 0440  AST 70* 55*  ALT 54 53  ALKPHOS 88 90  BILITOT 1.2 1.4*  PROT 6.5 6.1*  ALBUMIN 3.3* 3.2*    Recent Labs Lab 05/30/15 1140  LIPASE 13*   No results for input(s): AMMONIA in the last 168 hours. CBC:  Recent Labs Lab 05/30/15 1140 05/30/15 1151 05/31/15 0440  WBC 3.9*  --  4.6  NEUTROABS 3.0  --   --   HGB 14.8 15.6 12.7*  HCT 43.2 46.0 38.3*  MCV 88.3  --  88.5  PLT 82*  --  77*   Cardiac Enzymes:  Recent Labs Lab 05/30/15 1140  TROPONINI 0.03   BNP (last 3 results) No results for input(s): PROBNP in the last 8760 hours. CBG: No results for input(s): GLUCAP in the last 168 hours.  No results found for this or any previous visit (from the past 240 hour(s)).       Studies: Ct Head Wo Contrast  05/31/2015   CLINICAL DATA:  Headaches for 5 days.  The personal history of CVA.  EXAM: CT HEAD WITHOUT CONTRAST  TECHNIQUE: Contiguous axial images were obtained from the base of the skull through the vertex without intravenous contrast.  COMPARISON:  Report of MRI brain 01/02/2013.   FINDINGS: Mild generalized atrophy and white matter disease is noted. This was previously reported. A remote lacunar infarct is again noted within the left thalamus. No acute cortical infarct, hemorrhage, or mass lesion is present. The basal ganglia are otherwise intact. The insular ribbon is intact.  Remote sinus surgery is noted. Mild mucosal thickening is present within residual left ethmoid air cells. The remaining paranasal sinuses and mastoid air cells are clear. Atherosclerotic calcifications are present in the cavernous internal carotid arteries and at the dural margin of the vertebral arteries.  No significant extracranial soft tissue abnormality is present. The globes and orbits are intact.  IMPRESSION: 1. No acute intracranial abnormality or significant interval change. 2. Mild atrophy and white matter disease likely reflects the sequela of chronic microvascular ischemia. 3. Remote lacunar infarct of the left thalamus.   Electronically Signed   By: Marin Roberts M.D.   On: 05/31/2015 12:03   Ct Abdomen Pelvis W Contrast  05/30/2015  CLINICAL DATA:  Left lower quadrant pain, fever starting yesterday, status post appendectomy, cholecystectomy, hernia repair.  EXAM: CT ABDOMEN AND PELVIS WITH CONTRAST  TECHNIQUE: Multidetector CT imaging of the abdomen and pelvis was performed using the standard protocol following bolus administration of intravenous contrast.  CONTRAST:  25mL OMNIPAQUE IOHEXOL 300 MG/ML SOLN, OMNIPAQUE IOHEXOL 300 MG/ML SOLN  COMPARISON:  None.  FINDINGS: Lung bases are unremarkable. Atherosclerotic calcifications of abdominal aorta. Degenerative changes of the lumbar spine. There is dextroscoliosis of the lumbar spine.  Fatty infiltration of the liver is noted. Partially fatty replaced atrophic pancreas. The spleen and adrenal glands are unremarkable. Kidneys are symmetrical in size. There is no hydronephrosis or hydroureter. At least 2 nonobstructive calcified calculi are  noted within left kidney the largest in lower pole measures 7.7 mm.  Delayed renal images shows bilateral renal symmetrical excretion.  No calcified ureteral calculi are noted. In axial image 72 there is a calcified calculus in right posterior bladder wall measures 4.8 mm. This is probable within bladder or just beyond the right UPJ. No dilatation of distal right ureter. Mild enlarged prostate gland with indentation of urinary bladder base. There is a distended urinary bladder.  Moderate stool noted in right colon and transverse colon. There is minimal distended terminal small bowel with fecal like material see axial image 58. In axial image 72 there is a small right inguinal scrotal canal hernia containing fat and small bowel loop measures 2.3 cm. There is no definite evidence of small bowel obstruction at the level of hernia. However given the fecal like material within terminal small bowel partial small bowel obstruction or incompetent ileocecal valve cannot be excluded. No small bowel air-fluid levels are noted. There is no transition point in caliber of small bone. Seminal vesicles are unremarkable.  Sigmoid colon diverticula are noted. No evidence of acute colitis or diverticulitis  IMPRESSION: 1. Fatty infiltration of the liver.  Status postcholecystectomy. 2. Atrophic partially fatty replaced pancreas. 3. 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 No hydronephrosis or hydroureter. 4. There is probable calcified calculus in right posterior bladder wall probable just beyond the right UVJ measures 4.8 mm without evidence of ureteral obstruction. 5. There is a small right inguinal scrotal canal hernia containing fat and short segment of small bowel loop. Minimal distended terminal small bowel with fecal like material. Partial small bowel obstruction, ileus or incompetent ileal cecal valve cannot be excluded. Clinical correlation is necessary. No small bowel air-fluid levels. There is no evidence of transition  point in caliber of small bowel. 6. Enlarged prostate gland with indentation of urinary bladder wall. Moderate distended urinary bladder.   Electronically Signed   By: Natasha Mead M.D.   On: 05/30/2015 12:24   Dg Abd 2 Views  05/31/2015   CLINICAL DATA:  Suspect partial small bowel obstruction due to abdominal distention and fever  EXAM: ABDOMEN - 2 VIEW  COMPARISON:  Abdominal pelvic CT scan of May 30, 2015  FINDINGS: The colonic stool burden is increased diffusely. There is contrast in the right colon from yesterday's CT scan. There are a few loops of mildly distended air-filled small bowel in the mid abdomen. There are no free extraluminal gas collections. There are surgical clips in the gallbladder fossa and to the left of L4. There are degenerative changes of the lumbar spine.  IMPRESSION: There is no high-grade bowel obstruction nor evidence of perforation. A few loops of air and fluid-filled small bowel are present in the mid abdomen. Increase  colonic stool burden suggests clinical constipation.   Electronically Signed   By: David  [place] M.D.   On: 05/31/2015 07:33        Scheduled Meds: . doxycycline  100 mg Oral BID  . pantoprazole  40 mg Oral Daily  . polyethylene glycol  17 g Oral Daily  . senna  2 tablet Oral QHS  . tamsulosin  0.4 mg Oral BID   Continuous Infusions:   Principal Problem:   Thrombocytopenia Active Problems:   Abdominal pain   Rash and nonspecific skin eruption   Hypertension   Small bowel obstruction, partial    Time spent: 40 minutes    HONGALGI,ANAND, MD, FACP, FHM. [facility] Pager [phone]  If 7PM-7AM, please contact night-coverage [email or web address] Password [email or web address] 05/31/2015, 3:17 PM    LOS: 1 day

## 2015-05-31 NOTE — Consult Note (Addendum)
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 He reports noticing his health being subpar, roughly 8 days ago. He had new onset of joint pain, and feet pain as well as left post-auricular pain that he describes as sharp, shooting type of pain. He took tylenol 3 for relief. He also noticed having fatigue and weakness where he was unable to pull himself out of a chair, which is unlike him. He noted his temp being above his baseline (39F) running 99-100. The day prior to admit, he had bilateral lower extremity rash that spread up his legs. He sought medical care at this time. In the ED, he was found to have possible tick bite with surrounding erythema to his lower abdomen. Admit labs reveal, leukopenia with atypical lymphocytes, wbc of 3.9/66%N, plt of 82, AST 70, ALT 54. 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 After 2nd dose of oral doxycycline, he feels no better. Still wincing roughly 20 per hour due to "electric shock" like pain to left side of scalp.  He has childhood hx of chicken pox, never had shingles.  Past Medical History  Diagnosis Date  . Hypertension   . Gout   . GERD (gastroesophageal reflux disease)     Allergies:  Allergies  Allergen Reactions  . Penicillins Rash     MEDICATIONS: . doxycycline  100 mg Oral BID  . pantoprazole  40 mg Oral Daily  . polyethylene glycol  17 g Oral Daily  . senna  2 tablet Oral QHS  . tamsulosin  0.4 mg Oral BID    History  Substance Use Topics  . Smoking status: Never Smoker   . Smokeless tobacco: Not on file  . Alcohol Use: No    Family hx: No family history of multiple myeloma, no family history of guillan-barre or polymyalgia rheumatica. His brother had episode of shingles  Review of Systems  Constitutional: positive for chills, diaphoresis, activity change, appetite change, fatigue and unexpected weight change.  HENT: Negative for congestion, sore throat, rhinorrhea, sneezing, trouble swallowing and sinus pressure.  Eyes: Negative for photophobia and visual disturbance.  Respiratory: Negative for cough, chest tightness, shortness of breath, wheezing and stridor.  Cardiovascular: Negative for chest pain, palpitations and leg swelling.  Gastrointestinal: Negative for nausea, vomiting, abdominal pain, diarrhea, constipation, blood in stool, abdominal distention and anal bleeding.  Genitourinary: Negative for dysuria, hematuria, flank pain and difficulty urinating.  Musculoskeletal: joint pain Skin: rash Neurological: sharp pains to left side of head Hematological: Negative for adenopathy. Does not bruise/bleed easily.  Psychiatric/Behavioral: Negative for behavioral problems, confusion, sleep disturbance, dysphoric mood, decreased concentration and agitation.     OBJECTIVE: Temp:  [98 F (36.7  C)-98.5 F (36.9 C)] 98.4 F (36.9 C) (08/02 1336) Pulse Rate:  [88-94] 88 (08/02 1336) Resp:  [16-20] 18 (08/02 1336) BP: (119-146)/(61-89) 146/89 mmHg (08/02 1336) SpO2:  [97 %-100 %] 98 % (08/02 1336) Physical Exam  Constitutional: He is oriented to person, place, and time. He appears well-developed and well-nourished. No distress. He winces frequently during conversation associated with left post auricular  pain HENT:  Mouth/Throat: Oropharynx is clear and moist. No oropharyngeal exudate.  Cardiovascular: Normal rate, regular rhythm and normal heart sounds. Exam reveals no gallop and no friction rub.  No murmur heard.  Pulmonary/Chest: Effort normal and breath sounds normal. No respiratory distress. He has no wheezes.  Abdominal: Soft. Bowel sounds are normal. He exhibits no distension. There is no tenderness.  Lymphadenopathy:  He has no cervical adenopathy.  Ext: left elbow bursa/gouty elbow Neurological: He is alert and oriented to person, place, and time. Upper extremities 5/5 motor Skin: nonblanching, pinpoint petechaiel rash to lower extremities bilateraly up thighs to lower abdomen. Small black speck to lower abdomen with surround erythema. (difficult to tell if residual tick particle vs. Scab from wound) Psychiatric: He has a normal mood and affect. His behavior is normal.     LABS: Results for orders placed or performed during the hospital encounter of 05/30/15 (from the past 48 hour(s))  Comprehensive metabolic panel     Status: Abnormal   Collection Time: 05/30/15 11:40 AM  Result Value Ref Range   Sodium 136 135 - 145 mmol/L   Potassium 3.9 3.5 - 5.1 mmol/L   Chloride 99 (L) 101 - 111 mmol/L   CO2 28 22 - 32 mmol/L   Glucose, Bld 106 (H) 65 - 99 mg/dL   BUN 19 6 - 20 mg/dL   Creatinine, Ser 8.62 0.61 - 1.24 mg/dL   Calcium 9.1 8.9 - 87.0 mg/dL   Total Protein 6.5 6.5 - 8.1 g/dL   Albumin 3.3 (L) 3.5 - 5.0 g/dL   AST 70 (H) 15 - 41 U/L   ALT 54 17 - 63 U/L   Alkaline Phosphatase 88 38 - 126 U/L   Total Bilirubin 1.2 0.3 - 1.2 mg/dL   GFR calc non Af Amer >60 >60 mL/min   GFR calc Af Amer >60 >60 mL/min    Comment: (NOTE) The eGFR has been calculated using the CKD EPI equation. This calculation has not been validated in all clinical situations. eGFR's persistently <60 mL/min signify possible Chronic Kidney Disease.    Anion gap 9 5 - 15    Comment: Performed at  [facility]  Lipase, blood     Status: Abnormal   Collection Time: 05/30/15 11:40 AM  Result Value Ref Range   Lipase 13 (L) 22 - 51 U/L    Comment: Performed at [facility]  Troponin I     Status: None   Collection Time: 05/30/15 11:40 AM  Result Value Ref Range   Troponin I 0.03 <0.031 ng/mL    Comment:        NO INDICATION OF MYOCARDIAL INJURY.   CBC with Differential     Status: Abnormal   Collection Time: 05/30/15 11:40 AM  Result Value Ref Range   WBC 3.9 (L) 4.0 - 10.5 K/uL   RBC 4.89 4.22 - 5.81 MIL/uL   Hemoglobin 14.8 13.0 - 17.0 g/dL   HCT 48.2 47.8 - 14.5 %   MCV 88.3 78.0 - 100.0 fL   MCH 30.3 26.0 - 34.0 pg  MCHC 34.3 30.0 - 36.0 g/dL   RDW 13.1 11.5 - 15.5 %   Platelets 82 (L) 150 - 400 K/uL    Comment: PLATELET COUNT CONFIRMED BY SMEAR SPECIMEN CHECKED FOR CLOTS PLATELETS APPEAR DECREASED    Neutrophils Relative % 66 43 - 77 %   Lymphocytes Relative 12 12 - 46 %   Monocytes Relative 9 3 - 12 %   Eosinophils Relative 1 0 - 5 %   Basophils Relative 0 0 - 1 %   Band Neutrophils 10 0 - 10 %   Metamyelocytes Relative 1 %   Myelocytes 1 %   Neutro Abs 3.0 1.7 - 7.7 K/uL   Lymphs Abs 0.5 (L) 0.7 - 4.0 K/uL   Monocytes Absolute 0.4 0.1 - 1.0 K/uL   Eosinophils Absolute 0.0 0.0 - 0.7 K/uL   Basophils Absolute 0.0 0.0 - 0.1 K/uL   WBC Morphology ATYPICAL LYMPHOCYTES    Smear Review LARGE PLATELETS PRESENT     Comment: SPECIMEN CHECKED FOR CLOTS GIANT PLATELETS SEEN PLATELET COUNT CONFIRMED BY SMEAR PLATELETS APPEAR DECREASED   Protime-INR     Status: None   Collection Time: 05/30/15 11:40 AM  Result Value Ref Range   Prothrombin Time 15.2 11.6 - 15.2 seconds   INR 1.18 0.00 - 1.49  D-dimer, quantitative     Status: Abnormal   Collection Time: 05/30/15 11:40 AM  Result Value Ref Range   D-Dimer, Quant 12.57 (H) 0.00 - 0.48 ug/mL-FEU    Comment:        AT THE INHOUSE ESTABLISHED CUTOFF VALUE OF 0.48 ug/mL FEU, THIS ASSAY HAS BEEN  DOCUMENTED IN THE LITERATURE TO HAVE A SENSITIVITY AND NEGATIVE PREDICTIVE VALUE OF AT LEAST 98 TO 99%.  THE TEST RESULT SHOULD BE CORRELATED WITH AN ASSESSMENT OF THE CLINICAL PROBABILITY OF DVT / VTE.   Fibrinogen     Status: None   Collection Time: 05/30/15 11:40 AM  Result Value Ref Range   Fibrinogen 426 204 - 475 mg/dL  Lactate dehydrogenase     Status: Abnormal   Collection Time: 05/30/15 11:40 AM  Result Value Ref Range   LDH 304 (H) 98 - 192 U/L    Comment: Performed at [facility]  Urinalysis, Routine w reflex microscopic (not at [facility])     Status: Abnormal   Collection Time: 05/30/15 11:45 AM  Result Value Ref Range   Color, Urine AMBER (A) YELLOW    Comment: BIOCHEMICALS MAY BE AFFECTED BY COLOR   APPearance HAZY (A) CLEAR   Specific Gravity, Urine 1.029 1.005 - 1.030   pH 5.5 5.0 - 8.0   Glucose, UA NEGATIVE NEGATIVE mg/dL   Hgb urine dipstick NEGATIVE NEGATIVE   Bilirubin Urine NEGATIVE NEGATIVE   Ketones, ur NEGATIVE NEGATIVE mg/dL   Protein, ur NEGATIVE NEGATIVE mg/dL   Urobilinogen, UA 1.0 0.0 - 1.0 mg/dL   Nitrite NEGATIVE NEGATIVE   Leukocytes, UA NEGATIVE NEGATIVE    Comment: MICROSCOPIC NOT DONE ON URINES WITH NEGATIVE PROTEIN, BLOOD, LEUKOCYTES, NITRITE, OR GLUCOSE <1000 mg/dL.  I-stat Chem 8, ED (when main lab is down)     Status: Abnormal   Collection Time: 05/30/15 11:51 AM  Result Value Ref Range   Sodium 136 135 - 145 mmol/L   Potassium 3.8 3.5 - 5.1 mmol/L   Chloride 96 (L) 101 - 111 mmol/L   BUN 23 (H) 6 - 20 mg/dL   Creatinine, Ser 1.10 0.61 - 1.24 mg/dL   Glucose, Bld 105 (H)  65 - 99 mg/dL   Calcium, Ion 1.14 1.13 - 1.30 mmol/L   TCO2 24 0 - 100 mmol/L   Hemoglobin 15.6 13.0 - 17.0 g/dL   HCT 46.0 39.0 - 52.0 %  I-Stat CG4 Lactic Acid, ED     Status: None   Collection Time: 05/30/15 11:51 AM  Result Value Ref Range   Lactic Acid, Venous 1.40 0.5 - 2.0 mmol/L  Acetaminophen level     Status: Abnormal   Collection Time: 05/30/15   7:40 PM  Result Value Ref Range   Acetaminophen (Tylenol), Serum <10 (L) 10 - 30 ug/mL    Comment:        THERAPEUTIC CONCENTRATIONS VARY SIGNIFICANTLY. A RANGE OF 10-30 ug/mL MAY BE AN EFFECTIVE CONCENTRATION FOR MANY PATIENTS. HOWEVER, SOME ARE BEST TREATED AT CONCENTRATIONS OUTSIDE THIS RANGE. ACETAMINOPHEN CONCENTRATIONS >150 ug/mL AT 4 HOURS AFTER INGESTION AND >50 ug/mL AT 12 HOURS AFTER INGESTION ARE OFTEN ASSOCIATED WITH TOXIC REACTIONS.   CBC     Status: Abnormal   Collection Time: 05/31/15  4:40 AM  Result Value Ref Range   WBC 4.6 4.0 - 10.5 K/uL   RBC 4.33 4.22 - 5.81 MIL/uL   Hemoglobin 12.7 (L) 13.0 - 17.0 g/dL   HCT 38.3 (L) 39.0 - 52.0 %   MCV 88.5 78.0 - 100.0 fL   MCH 29.3 26.0 - 34.0 pg   MCHC 33.2 30.0 - 36.0 g/dL   RDW 13.0 11.5 - 15.5 %   Platelets 77 (L) 150 - 400 K/uL    Comment: SPECIMEN CHECKED FOR CLOTS REPEATED TO VERIFY PLATELET COUNT CONFIRMED BY SMEAR   Comprehensive metabolic panel     Status: Abnormal   Collection Time: 05/31/15  4:40 AM  Result Value Ref Range   Sodium 138 135 - 145 mmol/L   Potassium 4.3 3.5 - 5.1 mmol/L   Chloride 102 101 - 111 mmol/L   CO2 28 22 - 32 mmol/L   Glucose, Bld 101 (H) 65 - 99 mg/dL   BUN 19 6 - 20 mg/dL   Creatinine, Ser 1.26 (H) 0.61 - 1.24 mg/dL   Calcium 8.6 (L) 8.9 - 10.3 mg/dL   Total Protein 6.1 (L) 6.5 - 8.1 g/dL   Albumin 3.2 (L) 3.5 - 5.0 g/dL   AST 55 (H) 15 - 41 U/L   ALT 53 17 - 63 U/L   Alkaline Phosphatase 90 38 - 126 U/L   Total Bilirubin 1.4 (H) 0.3 - 1.2 mg/dL   GFR calc non Af Amer 54 (L) >60 mL/min   GFR calc Af Amer >60 >60 mL/min    Comment: (NOTE) The eGFR has been calculated using the CKD EPI equation. This calculation has not been validated in all clinical situations. eGFR's persistently <60 mL/min signify possible Chronic Kidney Disease.    Anion gap 8 5 - 15    MICRO: none IMAGING:  i reviewed [facility]CT images  Ct Head Wo Contrast  05/31/2015   CLINICAL DATA:   Headaches for 5 days.  The personal history of CVA.  EXAM: CT HEAD WITHOUT CONTRAST  TECHNIQUE: Contiguous axial images were obtained from the base of the skull through the vertex without intravenous contrast.  COMPARISON:  Report of MRI brain 01/02/2013.  FINDINGS: Mild generalized atrophy and white matter disease is noted. This was previously reported. A remote lacunar infarct is again noted within the left thalamus. No acute cortical infarct, hemorrhage, or mass lesion is present. The basal ganglia are otherwise intact. The  insular ribbon is intact.  Remote sinus surgery is noted. Mild mucosal thickening is present within residual left ethmoid air cells. The remaining paranasal sinuses and mastoid air cells are clear. Atherosclerotic calcifications are present in the cavernous internal carotid arteries and at the dural margin of the vertebral arteries.  No significant extracranial soft tissue abnormality is present. The globes and orbits are intact.  IMPRESSION: 1. No acute intracranial abnormality or significant interval change. 2. Mild atrophy and white matter disease likely reflects the sequela of chronic microvascular ischemia. 3. Remote lacunar infarct of the left thalamus.   Electronically Signed   By: Marin Roberts M.D.   On: 05/31/2015 12:03   Ct Abdomen Pelvis W Contrast  05/30/2015   CLINICAL DATA:  Left lower quadrant pain, fever starting yesterday, status post appendectomy, cholecystectomy, hernia repair.  EXAM: CT ABDOMEN AND PELVIS WITH CONTRAST  TECHNIQUE: Multidetector CT imaging of the abdomen and pelvis was performed using the standard protocol following bolus administration of intravenous contrast.  CONTRAST:  1mL OMNIPAQUE IOHEXOL 300 MG/ML SOLN, OMNIPAQUE IOHEXOL 300 MG/ML SOLN  COMPARISON:  None.  FINDINGS: Lung bases are unremarkable. Atherosclerotic calcifications of abdominal aorta. Degenerative changes of the lumbar spine. There is dextroscoliosis of the lumbar spine.   Fatty infiltration of the liver is noted. Partially fatty replaced atrophic pancreas. The spleen and adrenal glands are unremarkable. Kidneys are symmetrical in size. There is no hydronephrosis or hydroureter. At least 2 nonobstructive calcified calculi are noted within left kidney the largest in lower pole measures 7.7 mm.  Delayed renal images shows bilateral renal symmetrical excretion.  No calcified ureteral calculi are noted. In axial image 72 there is a calcified calculus in right posterior bladder wall measures 4.8 mm. This is probable within bladder or just beyond the right UPJ. No dilatation of distal right ureter. Mild enlarged prostate gland with indentation of urinary bladder base. There is a distended urinary bladder.  Moderate stool noted in right colon and transverse colon. There is minimal distended terminal small bowel with fecal like material see axial image 58. In axial image 72 there is a small right inguinal scrotal canal hernia containing fat and small bowel loop measures 2.3 cm. There is no definite evidence of small bowel obstruction at the level of hernia. However given the fecal like material within terminal small bowel partial small bowel obstruction or incompetent ileocecal valve cannot be excluded. No small bowel air-fluid levels are noted. There is no transition point in caliber of small bone. Seminal vesicles are unremarkable.  Sigmoid colon diverticula are noted. No evidence of acute colitis or diverticulitis  IMPRESSION: 1. Fatty infiltration of the liver.  Status postcholecystectomy. 2. Atrophic partially fatty replaced pancreas. 3. 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 No hydronephrosis or hydroureter. 4. There is probable calcified calculus in right posterior bladder wall probable just beyond the right UVJ measures 4.8 mm without evidence of ureteral obstruction. 5. There is a small right inguinal scrotal canal hernia containing fat and short segment of small bowel loop.  Minimal distended terminal small bowel with fecal like material. Partial small bowel obstruction, ileus or incompetent ileal cecal valve cannot be excluded. Clinical correlation is necessary. No small bowel air-fluid levels. There is no evidence of transition point in caliber of small bowel. 6. Enlarged prostate gland with indentation of urinary bladder wall. Moderate distended urinary bladder.   Electronically Signed   By: Natasha Mead M.D.   On: 05/30/2015 12:24   Dg Abd 2 Views  05/31/2015   CLINICAL DATA:  Suspect partial small bowel obstruction due to abdominal distention and fever  EXAM: ABDOMEN - 2 VIEW  COMPARISON:  Abdominal pelvic CT scan of May 30, 2015  FINDINGS: The colonic stool burden is increased diffusely. There is contrast in the right colon from yesterday's CT scan. There are a few loops of mildly distended air-filled small bowel in the mid abdomen. There are no free extraluminal gas collections. There are surgical clips in the gallbladder fossa and to the left of L4. There are degenerative changes of the lumbar spine.  IMPRESSION: There is no high-grade bowel obstruction nor evidence of perforation. A few loops of air and fluid-filled small bowel are present in the mid abdomen. Increase colonic stool burden suggests clinical constipation.   Electronically Signed   By: David  Martinique M.D.   On: 05/31/2015 07:33    Assessment/Plan: 74yo M with Hx of HTN, GERD, Gout,  Hx of peripheral neuropathy of bilateral hands, now presents with 1 week history of fatigue, chills, low grade fevers, and sharp shooting pains of different neuro distribution with possible proximal leg weakness (difficulty getting out of a chair). Labs reveal new onset leukopenia, thrombocytopenia with possibly mild AST/ALT increase + ? imbedded tick. This presentation does not completely fit tick borne illness.(patient drives up to the [place] ,so we should also consider lyme disease) I am concern that his presentation has  wider differential other than infectious causes - consider neuro consultation to see if further work up needed to explain neuropathy, mostly sensory - Consider peripheral neuropathy work-up, will check spep/IFA,  b12, ck to see if having myopathy -will check sed rate and crp - Will order mri brain w and w/o, thin cuts through brain stem - Unilateral neuropathic pain, acute, facial nerve distribution = would like to try Gabapentin $RemoveBefore'300mg'[id number]$  tid to see if that provides relief. - Possible tick borne illness= i have scraped off the remain speck using sterile hollow needle and cleaning area with alcohol. Changed oral doxy to IV doxycycline - health maintenance = hiv and hep c pending  Discussed care plan with drs. hongalgi and kirkpatrick

## 2015-05-31 NOTE — Progress Notes (Signed)
Pt refused cpap again tonight.  Pt was advised that RT is available all night and encouraged him to call should he change his mind.  RN aware.

## 2015-05-31 NOTE — Progress Notes (Signed)
Subjective: He says he has LLQ pain with deep inspiration, No BM for 2 days,  No pain on palpation, no nausea or vomiting.  Objective: Vital signs in last 24 hours: Temp:  [98 F (36.7 C)-98.6 F (37 C)] 98 F (36.7 C) (08/02 0509) Pulse Rate:  [84-94] 90 (08/02 0509) Resp:  [16-20] 20 (08/02 0509) BP: (119-174)/(61-102) 126/75 mmHg (08/02 0509) SpO2:  [96 %-100 %] 100 % (08/02 0509) Weight:  [96.163 kg (212 lb)] 96.163 kg (212 lb) (08/01 1022) Last BM Date: 05/29/15 Afebrile, VSS Creatinine is up and bilirubin is up some AST is better WBC is normal H/H down some Film this AM:  The colonic stool burden is increased diffusely. There is contrast in the right colon from yesterday's CT scan. There are a few loops of mildly distended air-filled small bowel in the mid abdomen. There are no free extraluminal gas collections.  Intake/Output from previous day: 08/01 0701 - 08/02 0700 In: 595.8 [I.V.:595.8] Out: 2825 [Urine:2825] Intake/Output this shift:    General appearance: alert, cooperative and no distress GI: soft, somewhat large abdomen.  not tender to palpation, + BS.  Lab Results:   Recent Labs  05/30/15 1140 05/30/15 1151 05/31/15 0440  WBC 3.9*  --  4.6  HGB 14.8 15.6 12.7*  HCT 43.2 46.0 38.3*  PLT 82*  --  77*    BMET  Recent Labs  05/30/15 1140 05/30/15 1151 05/31/15 0440  NA 136 136 138  K 3.9 3.8 4.3  CL 99* 96* 102  CO2 28  --  28  GLUCOSE 106* 105* 101*  BUN 19 23* 19  CREATININE 1.14 1.10 1.26*  CALCIUM 9.1  --  8.6*   PT/INR  Recent Labs  05/30/15 1140  LABPROT 15.2  INR 1.18     Recent Labs Lab 05/30/15 1140 05/31/15 0440  AST 70* 55*  ALT 54 53  ALKPHOS 88 90  BILITOT 1.2 1.4*  PROT 6.5 6.1*  ALBUMIN 3.3* 3.2*     Lipase     Component Value Date/Time   LIPASE 13* 05/30/2015 1140     Studies/Results: Ct Abdomen Pelvis W Contrast  05/30/2015   CLINICAL DATA:  Left lower quadrant pain, fever starting yesterday,  status post appendectomy, cholecystectomy, hernia repair.  EXAM: CT ABDOMEN AND PELVIS WITH CONTRAST  TECHNIQUE: Multidetector CT imaging of the abdomen and pelvis was performed using the standard protocol following bolus administration of intravenous contrast.  CONTRAST:  25mL OMNIPAQUE IOHEXOL 300 MG/ML SOLN, OMNIPAQUE IOHEXOL 300 MG/ML SOLN  COMPARISON:  None.  FINDINGS: Lung bases are unremarkable. Atherosclerotic calcifications of abdominal aorta. Degenerative changes of the lumbar spine. There is dextroscoliosis of the lumbar spine.  Fatty infiltration of the liver is noted. Partially fatty replaced atrophic pancreas. The spleen and adrenal glands are unremarkable. Kidneys are symmetrical in size. There is no hydronephrosis or hydroureter. At least 2 nonobstructive calcified calculi are noted within left kidney the largest in lower pole measures 7.7 mm.  Delayed renal images shows bilateral renal symmetrical excretion.  No calcified ureteral calculi are noted. In axial image 72 there is a calcified calculus in right posterior bladder wall measures 4.8 mm. This is probable within bladder or just beyond the right UPJ. No dilatation of distal right ureter. Mild enlarged prostate gland with indentation of urinary bladder base. There is a distended urinary bladder.  Moderate stool noted in right colon and transverse colon. There is minimal distended terminal small bowel with fecal like material  see axial image 58. In axial image 72 there is a small right inguinal scrotal canal hernia containing fat and small bowel loop measures 2.3 cm. There is no definite evidence of small bowel obstruction at the level of hernia. However given the fecal like material within terminal small bowel partial small bowel obstruction or incompetent ileocecal valve cannot be excluded. No small bowel air-fluid levels are noted. There is no transition point in caliber of small bone. Seminal vesicles are unremarkable.  Sigmoid colon  diverticula are noted. No evidence of acute colitis or diverticulitis  IMPRESSION: 1. Fatty infiltration of the liver.  Status postcholecystectomy. 2. Atrophic partially fatty replaced pancreas. 3. 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 No hydronephrosis or hydroureter. 4. There is probable calcified calculus in right posterior bladder wall probable just beyond the right UVJ measures 4.8 mm without evidence of ureteral obstruction. 5. There is a small right inguinal scrotal canal hernia containing fat and short segment of small bowel loop. Minimal distended terminal small bowel with fecal like material. Partial small bowel obstruction, ileus or incompetent ileal cecal valve cannot be excluded. Clinical correlation is necessary. No small bowel air-fluid levels. There is no evidence of transition point in caliber of small bowel. 6. Enlarged prostate gland with indentation of urinary bladder wall. Moderate distended urinary bladder.   Electronically Signed   By: Natasha Mead M.D.   On: 05/30/2015 12:24   Dg Abd 2 Views  05/31/2015   CLINICAL DATA:  Suspect partial small bowel obstruction due to abdominal distention and fever  EXAM: ABDOMEN - 2 VIEW  COMPARISON:  Abdominal pelvic CT scan of May 30, 2015  FINDINGS: The colonic stool burden is increased diffusely. There is contrast in the right colon from yesterday's CT scan. There are a few loops of mildly distended air-filled small bowel in the mid abdomen. There are no free extraluminal gas collections. There are surgical clips in the gallbladder fossa and to the left of L4. There are degenerative changes of the lumbar spine.  IMPRESSION: There is no high-grade bowel obstruction nor evidence of perforation. A few loops of air and fluid-filled small bowel are present in the mid abdomen. Increase colonic stool burden suggests clinical constipation.   Electronically Signed   By: David  [place] M.D.   On: 05/31/2015 07:33    Medications: . doxycycline  100 mg  Oral BID  . pantoprazole  40 mg Oral Daily  . senna  2 tablet Oral QHS  . tamsulosin  0.4 mg Oral BID    Assessment/Plan LLQ pain SBO vs constipation Recurrent RIH Antibiotics:  Doxycycline x 1 DVT:  SCD    Plan:  I do not see anything to indicate any acute abdominal issues.  He has some significant constipation.  Contrast in right colon.     LOS: 1 day    Heavin Sebree 05/31/2015

## 2015-05-31 NOTE — Consult Note (Signed)
Neurology Consultation Reason for Consult: paresthesia Referring Physician: Hongalgi, A  CC: Paresthesia  History is obtained from: Patient  HPI: Leroy Morrison is a 74 y.o. male with a history of htn, gout who presents with multiple symptoms. He describes that last Thursday, he had bowel incontinence. It was watery, and he felt the urge to go, but was unable to to make ti to the restroom. He subsequently began noticing electric shock-like sensations in his legs, then proceedign to involve his whole body when they would hit.   More painful for him, however, is a left facial pain which comes in attacks. He describes pain free intervals between attacks, but the attacks are frequent occuring every few minutes.     ROS: A 14 point ROS was performed and is negative except as noted in the HPI.   Past Medical History  Diagnosis Date  . Hypertension   . Gout   . GERD (gastroesophageal reflux disease)   Stroke(per patient previously seen on MRI two years ago)  Family History: No hx nerve disorders or autoimmune disease.   Social History: Tob: denies  Exam: Current vital signs: BP 146/89 mmHg  Pulse 88  Temp(Src) 98.4 F (36.9 C) (Oral)  Resp 18  Ht $R'6\' 1"'Ia$  (1.854 m)  Wt 96.163 kg (212 lb)  BMI 27.98 kg/m2  SpO2 98% Vital signs in last 24 hours: Temp:  [98 F (36.7 C)-98.5 F (36.9 C)] 98.4 F (36.9 C) (08/02 1336) Pulse Rate:  [88-90] 88 (08/02 1336) Resp:  [18-20] 18 (08/02 1336) BP: (119-146)/(75-89) 146/89 mmHg (08/02 1336) SpO2:  [97 %-100 %] 98 % (08/02 1336)   Physical Exam  Constitutional: Appears well-developed and well-nourished.  Psych: Affect appropriate to situation Eyes: No scleral injection HENT: Erythematous pharynx Head: Normocephalic, tender to palpation over the left TMJ. No tenderness at the temple or occipital notch.  Cardiovascular: Normal rate and regular rhythm.  Respiratory: Effort normal  GI: Soft.  No distension. There is no tenderness.   Skin: WDI Lymph: Tender left inguinal lymph node.   Neuro: Mental Status: Patient is awake, alert, oriented to person, place, month, year, and situation. Patient is able to give a clear and coherent history. No signs of aphasia or neglect Cranial Nerves: II: Visual Fields are full. Pupils are equal, round, and reactive to light.   III,IV, VI: EOMI without ptosis or diploplia.  V: Facial sensation is symmetric to temperature VII: Facial movement is symmetric.  VIII: hearing is intact to voice X: Uvula elevates symmetrically XI: Shoulder shrug is symmetric. XII: tongue is midline without atrophy or fasciculations.  Motor: Tone is normal. Bulk is normal. 5/5 strength was present in all four extremities. He is able to stand from a seated position withou using his hands.  Sensory: Sensation is symmetric to light touch and temperature in the arms and legs. He has mildly reduced proprioception in the toes.  Deep Tendon Reflexes: 2+ and symmetric in the biceps and patellae. He has mildly reduced reflexes(1+) at the ankles.  Cerebellar: FNF intact bilaterally  L'hermitte's sign negative.    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   Impression: 74 yo M with paresthesias of the entire body, as well as left facial pain. I do not think there is any evidence of mononeuritis multiplex. The whole body paresthesias sound like a description of  L'hermitte's sign, but with flexion of his neck, this is not reproduced. I do think looking at the C-spine would be reasonable. An MRI has already been ordered of his head, and this will hopefully allow visualization of the TMJ as well.   The facial pain is atypical and though it could be new onset trigeminal neuralgia, the tenderness over the TMJ makes me concerned that this might be the source of his  pain.   Facial pain is unusual, but GCA may need to be considered if no other explanation is found.   Recommendations: 1) MRI C-spine 2) Agree with ANCA, esr, CRP 3) will continue to follow.    Roland Rack, MD [facility] [phone]  If 7pm- 7am, please page neurology on call as listed in [place].

## 2015-06-01 ENCOUNTER — Observation Stay (HOSPITAL_COMMUNITY): Payer: Medicare HMO

## 2015-06-01 DIAGNOSIS — D696 Thrombocytopenia, unspecified: Secondary | ICD-10-CM | POA: Diagnosis not present

## 2015-06-01 LAB — COMPREHENSIVE METABOLIC PANEL
ALK PHOS: 107 U/L (ref 38–126)
ALT: 61 U/L (ref 17–63)
AST: 56 U/L — ABNORMAL HIGH (ref 15–41)
Albumin: 3.3 g/dL — ABNORMAL LOW (ref 3.5–5.0)
Anion gap: 7 (ref 5–15)
BILIRUBIN TOTAL: 1.4 mg/dL — AB (ref 0.3–1.2)
BUN: 17 mg/dL (ref 6–20)
CHLORIDE: 101 mmol/L (ref 101–111)
CO2: 26 mmol/L (ref 22–32)
Calcium: 8.6 mg/dL — ABNORMAL LOW (ref 8.9–10.3)
Creatinine, Ser: 1.06 mg/dL (ref 0.61–1.24)
GFR calc Af Amer: >60 mL/min (ref >60)
GFR calc non Af Amer: >60 mL/min (ref >60)
Glucose, Bld: 100 mg/dL — ABNORMAL HIGH (ref 65–99)
Potassium: 4.1 mmol/L (ref 3.5–5.1)
SODIUM: 134 mmol/L — AB (ref 135–145)
Total Protein: 6.5 g/dL (ref 6.5–8.1)

## 2015-06-01 LAB — HEPATITIS PANEL, ACUTE
HEP B C IGM: NEGATIVE
HEP B S AG: NEGATIVE
Hep A IgM: NEGATIVE

## 2015-06-01 LAB — HIV ANTIBODY (ROUTINE TESTING W REFLEX): HIV SCREEN 4TH GENERATION: NONREACTIVE

## 2015-06-01 LAB — CBC WITH DIFFERENTIAL/PLATELET
Basophils Absolute: 0.1 10*3/uL (ref 0.0–0.1)
Basophils Relative: 1 % (ref 0–1)
EOS ABS: 0.1 10*3/uL (ref 0.0–0.7)
EOS PCT: 2 % (ref 0–5)
HEMATOCRIT: 39.1 % (ref 39.0–52.0)
Hemoglobin: 13.6 g/dL (ref 13.0–17.0)
LYMPHS ABS: 0.6 10*3/uL — AB (ref 0.7–4.0)
Lymphocytes Relative: 11 % — ABNORMAL LOW (ref 12–46)
MCH: 30.6 pg (ref 26.0–34.0)
MCHC: 34.8 g/dL (ref 30.0–36.0)
MCV: 87.9 fL (ref 78.0–100.0)
Monocytes Absolute: 0.4 10*3/uL (ref 0.1–1.0)
Monocytes Relative: 7 % (ref 3–12)
Neutro Abs: 4.3 10*3/uL (ref 1.7–7.7)
Neutrophils Relative %: 78 % — ABNORMAL HIGH (ref 43–77)
Platelets: 81 10*3/uL — ABNORMAL LOW (ref 150–400)
RBC: 4.45 MIL/uL (ref 4.22–5.81)
RDW: 13 % (ref 11.5–15.5)
WBC: 5.5 10*3/uL (ref 4.0–10.5)

## 2015-06-01 LAB — ROCKY MTN SPOTTED FVR ABS PNL(IGG+IGM)
RMSF IgG: NEGATIVE
RMSF IgM: 0.28 index (ref 0.00–0.89)

## 2015-06-01 LAB — B. BURGDORFI ANTIBODIES: B burgdorferi Ab IgG+IgM: <0.91 {ISR} (ref 0.00–0.90)

## 2015-06-01 LAB — SAVE SMEAR

## 2015-06-01 MED ORDER — CARBAMAZEPINE ER 200 MG PO TB12
200.0000 mg | ORAL_TABLET | Freq: Two times a day (BID) | ORAL | Status: DC
  Administered 2015-06-01 – 2015-06-04 (×6): 200 mg via ORAL
  Filled 2015-06-01 (×8): qty 1

## 2015-06-01 MED ORDER — CARBAMAZEPINE ER 200 MG PO TB12
200.0000 mg | ORAL_TABLET | Freq: Two times a day (BID) | ORAL | Status: DC
  Filled 2015-06-01: qty 1

## 2015-06-01 MED ORDER — MORPHINE SULFATE 2 MG/ML IJ SOLN
2.0000 mg | Freq: Once | INTRAMUSCULAR | Status: AC
Start: 2015-06-01 — End: 2015-06-01
  Administered 2015-06-01: 2 mg via INTRAVENOUS
  Filled 2015-06-01: qty 1

## 2015-06-01 MED ORDER — OXYCODONE HCL 5 MG PO TABS
5.0000 mg | ORAL_TABLET | ORAL | Status: DC | PRN
  Administered 2015-06-02 – 2015-06-04 (×4): 5 mg via ORAL
  Filled 2015-06-01 (×4): qty 1

## 2015-06-01 NOTE — Progress Notes (Signed)
Pt having complaints of inability to void after multiple attempts. Pt bladder scanned and was found to have 444cc. Order obtained for in and out cath. 540cc of amber colored urine returned. Pt tolerated well.

## 2015-06-01 NOTE — Progress Notes (Signed)
Subjective: Continues to have episodic facial pain with attacks that are frequent but brief. Seem to be brought on by chewing.   Exam: Filed Vitals:   06/01/15 0510  BP: 130/88  Pulse: 106  Temp: 99.6 F (37.6 C)  Resp: 20   Gen: In bed, NAD Resp: non-labored breathing, no acute distress Abd: soft, nt  Neuro: MS: awake and alert, appropriate/   BF:XOVA, PERRL Motor: MAEW Sensory:intact to LT  Pertinent Labs: Markedly elevated CRP   Impression: 74 yo M with facial pain in the setting of other systemic symptoms including petechial rash, subjective fevers, elevated CRP and less so ESR. GCA may still need to be considered. I typically do not think of petechial rash just from thrombocytopenia with platelets in the 70s and wonder if a biopsy of this rash could be helpful to determining the diagnosis.   Recommendations: 1) Awaiting ANCA, Mpo/pr3 ab, RMSF, Erlichia, lyme titers.  2) MRI c-spine, head MRI(inlcuding left TMJ), C-spine MRI(given lhermitte like phenomenon) 3) continue gabapentin, if no relief, would try tegretol though I am still not certain of a neuropathic cause of his pain.  4) I have ordered a one time dose of morphine 60m IV prior to MRI to help him hold still.   MRoland Rack MD [facility] [phone] If 7pm- 7am, please page neurology on call as listed in [place]

## 2015-06-01 NOTE — Progress Notes (Signed)
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 Patient was sent to [facility] as a direct admit to the hospitalist service. Surgery evaluated and signed off-no acute abdominal issues. ID consulted for evaluation of infectious etiology as cause for his presentation.   Assessment/Plan:  Thrombocytopenia with petechial rash - Unclear etiology. No prior lab work to know chronicity of same. As per EDP note, patient had platelet count of 100, 4 days pta. - DD-infectious etiology (RMSF, Ehrlichiosis, Lyme's. Note mild leukopenia and mild transaminitis), primary blood disorder, medications versus other etiology. - Infectious disease consult greatly appreciated, follow on RMSF and Ehrlichia panel, Lyme Abs, acute hepatitis panel and HIV antibody screen - Patient started on IV doxycycline by ID, as suspicious for tick borne disease (Embedded tick, tick bite mark and abdomen?) - peripheral smear reviewed by pathologist showing activated lymphocytes and thrombocytopenia  - Elevated d-dimer (12.57)-probably an acute phase reactant in the absence of respiratory symptoms or chest pain. Fibrinogen 426. - Significantly  elevated CRP   Abdominal pain - History is conflicting. EDP note states abdominal pain of a few days' duration. Patient however indicates that the first time he noted abdominal pain was in the ED when he was asked to take a deep breath - CT findings as above. No acute findings on abdominal exam. - Surgery input appreciated-no acute surgical needs. Recommend bowel regimen for constipation and outpatient follow-up regarding recurrent right inguinal hernia.  OSA - Continue nightly CPAP  GERD - Continue PPI  Hypertension - Patient adamantly denies and states that he only has white coat hypertension. For now monitor.  Leukopenia - Resolved  Headache/? Scalp pain - Unclear etiology. No focal deficits. Localized headache around left year/left occipital scalp. No acute findings on otoscopic exam bilaterally. - CT head: No acute findings - No meningismus. - Neurology input appreciated, awaiting MRI brain and neck .  Constipation - Bowel regimen.   DVT prophylaxis: SCDs Code Status: Full  Family Communication: None at bedside  Disposition Plan: DC home when medically stable.    Consultants - Infectious disease  - Neurology   procedures:  None  Antibiotics:  Doxycycline 8/1   Subjective: Continues to complain of intermittent pain at left occipital scalp & around left ear.  Objective: Filed Vitals:   05/31/15 0509 05/31/15 1336 05/31/15 2128 06/01/15 0510  BP: 126/75 146/89 142/65 130/88  Pulse: 90 88 95 106  Temp: 98 F (36.7 C) 98.4 F (36.9 C) 98.7 F (37.1 C) 99.6 F (37.6 C)  TempSrc: Oral Oral Oral Oral  Resp: 20 18 18 20   Height:      Weight:      SpO2: 100% 98% 99% 95%    Intake/Output Summary (Last 24 hours) at 06/01/15 1358 Last data filed at 06/01/15  0945  Gross per 24 hour  Intake   1540 ml  Output    740 ml  Net    800 ml   Filed Weights   05/30/15 1022  Weight: 96.163 kg (212 lb)     Exam:  General exam: elderly male , comfortable  No neck stiffness. Neck supple. Respiratory system: Clear. No increased work of breathing. Cardiovascular system: S1 & S2 heard, RRR. No JVD, murmurs, gallops, clicks or pedal edema. Gastrointestinal system: Abdomen is nondistended, soft and nontender. Normal bowel sounds heard. Central nervous system: Alert and oriented. No focal neurological deficits. Extremities: Symmetric 5 x 5 power. Skin: Fine petechial rash on symmetric legs mostly below the knees and to a lesser severity on the medial aspect of both thighs.      Data Reviewed: Basic Metabolic Panel:  Recent Labs Lab 05/30/15 1140 05/30/15 1151 05/31/15 0440 06/01/15 0425  NA 136 136 138 134*  K 3.9 3.8 4.3 4.1  CL 99* 96* 102 101  CO2 28  --  28 26  GLUCOSE 106* 105* 101* 100*  BUN 19 23* 19 17  CREATININE 1.14 1.10 1.26* 1.06  CALCIUM 9.1  --  8.6* 8.6*   Liver Function Tests:  Recent Labs Lab 05/30/15 1140 05/31/15 0440 06/01/15 0425  AST 70* 55* 56*  ALT 54 53 61  ALKPHOS 88 90 107  BILITOT 1.2 1.4* 1.4*  PROT 6.5 6.1* 6.5  ALBUMIN 3.3* 3.2* 3.3*    Recent Labs Lab 05/30/15 1140  LIPASE 13*   No results for input(s): AMMONIA in the last 168 hours. CBC:  Recent Labs Lab 05/30/15 1140 05/30/15 1151 05/31/15 0440 06/01/15 0425  WBC 3.9*  --  4.6 5.5  NEUTROABS 3.0  --  2.8 4.3  HGB 14.8 15.6 12.7* 13.6  HCT 43.2 46.0 38.3* 39.1  MCV 88.3  --  88.5 87.9  PLT 82*  --  77* 81*   Cardiac Enzymes:  Recent Labs Lab 05/30/15 1140 05/31/15 1740  CKTOTAL  --  77  TROPONINI 0.03  --    BNP (last 3 results) No results for input(s): PROBNP in the last 8760 hours. CBG: No results for input(s): GLUCAP in the last 168 hours.  No results found for this or any previous visit (from the past 240 hour(s)).       Studies: Ct Head Wo Contrast  05/31/2015   CLINICAL DATA:  Headaches for 5 days.  The personal history of CVA.  EXAM: CT HEAD WITHOUT CONTRAST  TECHNIQUE: Contiguous axial images were  obtained from the base of the skull through the vertex without intravenous contrast.  COMPARISON:  Report of MRI brain 01/02/2013.  FINDINGS: Mild generalized atrophy and white matter disease is noted. This was previously reported. A remote lacunar infarct is again noted within the left thalamus. No acute cortical infarct, hemorrhage, or mass lesion is present. The basal ganglia are otherwise intact. The insular ribbon is intact.  Remote sinus surgery is noted. Mild mucosal thickening is present within residual left ethmoid air cells. The remaining paranasal sinuses and mastoid air cells are clear. Atherosclerotic calcifications are present in the cavernous internal carotid arteries and at the dural margin of the vertebral arteries.  No significant extracranial soft tissue abnormality is present. The globes and orbits are intact.  IMPRESSION: 1. No acute intracranial abnormality or significant interval change. 2. Mild atrophy and white matter disease likely reflects the sequela of chronic microvascular ischemia. 3. Remote lacunar infarct of the left  thalamus.   Electronically Signed   By: Marin Roberts M.D.   On: 05/31/2015 12:03   Dg Abd 2 Views  05/31/2015   CLINICAL DATA:  Suspect partial small bowel obstruction due to abdominal distention and fever  EXAM: ABDOMEN - 2 VIEW  COMPARISON:  Abdominal pelvic CT scan of May 30, 2015  FINDINGS: The colonic stool burden is increased diffusely. There is contrast in the right colon from yesterday's CT scan. There are a few loops of mildly distended air-filled small bowel in the mid abdomen. There are no free extraluminal gas collections. There are surgical clips in the gallbladder fossa and to the left of L4. There are degenerative changes of the lumbar spine.  IMPRESSION: There is no high-grade bowel obstruction nor evidence of perforation. A few loops of air and fluid-filled small bowel are present in the mid abdomen. Increase colonic stool burden suggests  clinical constipation.   Electronically Signed   By: David  [place] M.D.   On: 05/31/2015 07:33        Scheduled Meds: . doxycycline (VIBRAMYCIN) IV  100 mg Intravenous Q12H  . gabapentin  300 mg Oral TID  . pantoprazole  40 mg Oral Daily  . polyethylene glycol  17 g Oral Daily  . senna  2 tablet Oral QHS  . tamsulosin  0.4 mg Oral BID   Continuous Infusions:   Principal Problem:   Thrombocytopenia Active Problems:   Abdominal pain   Rash and nonspecific skin eruption   Hypertension   Small bowel obstruction, partial   Headache    Time spent: 30 minutes    Mantaj Chamberlin, MD [facility] Pager [phone]  If 7PM-7AM, please contact night-coverage [email or web address] Password [email or web address] 06/01/2015, 1:58 PM    LOS: 2 days

## 2015-06-02 DIAGNOSIS — D696 Thrombocytopenia, unspecified: Secondary | ICD-10-CM | POA: Diagnosis present

## 2015-06-02 DIAGNOSIS — R51 Headache: Secondary | ICD-10-CM | POA: Diagnosis present

## 2015-06-02 DIAGNOSIS — Z79899 Other long term (current) drug therapy: Secondary | ICD-10-CM | POA: Diagnosis not present

## 2015-06-02 DIAGNOSIS — K760 Fatty (change of) liver, not elsewhere classified: Secondary | ICD-10-CM | POA: Diagnosis present

## 2015-06-02 DIAGNOSIS — I10 Essential (primary) hypertension: Secondary | ICD-10-CM | POA: Diagnosis present

## 2015-06-02 DIAGNOSIS — M109 Gout, unspecified: Secondary | ICD-10-CM | POA: Diagnosis present

## 2015-06-02 DIAGNOSIS — N210 Calculus in bladder: Secondary | ICD-10-CM | POA: Diagnosis present

## 2015-06-02 DIAGNOSIS — K219 Gastro-esophageal reflux disease without esophagitis: Secondary | ICD-10-CM | POA: Diagnosis present

## 2015-06-02 DIAGNOSIS — Z880 Allergy status to penicillin: Secondary | ICD-10-CM | POA: Diagnosis not present

## 2015-06-02 DIAGNOSIS — E222 Syndrome of inappropriate secretion of antidiuretic hormone: Secondary | ICD-10-CM | POA: Diagnosis present

## 2015-06-02 DIAGNOSIS — D72819 Decreased white blood cell count, unspecified: Secondary | ICD-10-CM | POA: Diagnosis present

## 2015-06-02 DIAGNOSIS — K4031 Unilateral inguinal hernia, with obstruction, without gangrene, recurrent: Secondary | ICD-10-CM | POA: Diagnosis present

## 2015-06-02 DIAGNOSIS — G4733 Obstructive sleep apnea (adult) (pediatric): Secondary | ICD-10-CM | POA: Diagnosis present

## 2015-06-02 DIAGNOSIS — A7740 Ehrlichiosis, unspecified: Secondary | ICD-10-CM | POA: Diagnosis present

## 2015-06-02 DIAGNOSIS — Z79891 Long term (current) use of opiate analgesic: Secondary | ICD-10-CM | POA: Diagnosis not present

## 2015-06-02 DIAGNOSIS — Z9049 Acquired absence of other specified parts of digestive tract: Secondary | ICD-10-CM | POA: Diagnosis present

## 2015-06-02 DIAGNOSIS — R21 Rash and other nonspecific skin eruption: Secondary | ICD-10-CM | POA: Diagnosis present

## 2015-06-02 DIAGNOSIS — I776 Arteritis, unspecified: Secondary | ICD-10-CM | POA: Diagnosis present

## 2015-06-02 DIAGNOSIS — N200 Calculus of kidney: Secondary | ICD-10-CM | POA: Diagnosis present

## 2015-06-02 LAB — ANCA TITERS
Atypical P-ANCA titer: <1:20 {titer}
C-ANCA: <1:20 {titer}

## 2015-06-02 LAB — PROTEIN ELECTROPHORESIS, SERUM
A/G Ratio: 1.1 (ref 0.7–1.7)
ALPHA-2-GLOBULIN: 0.4 g/dL (ref 0.4–1.0)
Albumin ELP: 3.3 g/dL (ref 2.9–4.4)
Alpha-1-Globulin: 0.4 g/dL (ref 0.0–0.4)
BETA GLOBULIN: 1.1 g/dL (ref 0.7–1.3)
GAMMA GLOBULIN: 1.2 g/dL (ref 0.4–1.8)
Globulin, Total: 3.1 g/dL (ref 2.2–3.9)
M-SPIKE, %: 0.4 g/dL — AB
TOTAL PROTEIN ELP: 6.4 g/dL (ref 6.0–8.5)

## 2015-06-02 LAB — COMPREHENSIVE METABOLIC PANEL
ALK PHOS: 98 U/L (ref 38–126)
ALT: 45 U/L (ref 17–63)
AST: 37 U/L (ref 15–41)
Albumin: 3 g/dL — ABNORMAL LOW (ref 3.5–5.0)
Anion gap: 9 (ref 5–15)
BUN: 18 mg/dL (ref 6–20)
CO2: 23 mmol/L (ref 22–32)
CREATININE: 1.01 mg/dL (ref 0.61–1.24)
Calcium: 8.5 mg/dL — ABNORMAL LOW (ref 8.9–10.3)
Chloride: 96 mmol/L — ABNORMAL LOW (ref 101–111)
GFR calc non Af Amer: >60 mL/min (ref >60)
GLUCOSE: 136 mg/dL — AB (ref 65–99)
Potassium: 4 mmol/L (ref 3.5–5.1)
Sodium: 128 mmol/L — ABNORMAL LOW (ref 135–145)
TOTAL PROTEIN: 6.4 g/dL — AB (ref 6.5–8.1)
Total Bilirubin: 1.6 mg/dL — ABNORMAL HIGH (ref 0.3–1.2)

## 2015-06-02 LAB — CBC
HCT: 37.1 % — ABNORMAL LOW (ref 39.0–52.0)
Hemoglobin: 12.5 g/dL — ABNORMAL LOW (ref 13.0–17.0)
MCH: 29.1 pg (ref 26.0–34.0)
MCHC: 33.7 g/dL (ref 30.0–36.0)
MCV: 86.3 fL (ref 78.0–100.0)
Platelets: 98 10*3/uL — ABNORMAL LOW (ref 150–400)
RBC: 4.3 MIL/uL (ref 4.22–5.81)
RDW: 13 % (ref 11.5–15.5)
WBC: 6.7 10*3/uL (ref 4.0–10.5)

## 2015-06-02 LAB — OSMOLALITY: Osmolality: 270 mOsm/kg — ABNORMAL LOW (ref 275–300)

## 2015-06-02 LAB — MPO/PR-3 (ANCA) ANTIBODIES: Myeloperoxidase Abs: <9 U/mL (ref 0.0–9.0)

## 2015-06-02 LAB — OSMOLALITY, URINE: Osmolality, Ur: 593 mOsm/kg (ref 390–1090)

## 2015-06-02 LAB — SODIUM, URINE, RANDOM: Sodium, Ur: 33 mmol/L

## 2015-06-02 MED ORDER — LIDOCAINE HCL 2 % IJ SOLN
0.0000 mL | Freq: Once | INTRAMUSCULAR | Status: AC | PRN
  Filled 2015-06-02: qty 20

## 2015-06-02 MED ORDER — FUROSEMIDE 10 MG/ML IJ SOLN
20.0000 mg | Freq: Once | INTRAMUSCULAR | Status: AC
  Administered 2015-06-02: 20 mg via INTRAVENOUS
  Filled 2015-06-02: qty 2

## 2015-06-02 MED ORDER — NAPROXEN 500 MG PO TABS
500.0000 mg | ORAL_TABLET | Freq: Two times a day (BID) | ORAL | Status: DC
  Administered 2015-06-02 – 2015-06-04 (×4): 500 mg via ORAL
  Filled 2015-06-02 (×6): qty 1

## 2015-06-02 NOTE — Progress Notes (Signed)
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 Patient was sent to [facility] as a direct admit to the hospitalist service. Surgery evaluated and signed off-no acute abdominal issues. ID consulted for evaluation of infectious etiology as cause for his presentation.   Assessment/Plan:  Thrombocytopenia with petechial rash - Unclear etiology. No prior lab work to know chronicity of same. As per EDP note, patient had platelet count of 100, 4 days pta. - DD-infectious etiology (RMSF, Ehrlichiosis, Lyme's. Note mild leukopenia and mild transaminitis), primary blood disorder, autoimmune including vasculitis, medications versus other etiology. - Infectious disease consult greatly appreciated,  Ehrlichia panel still pending, RMSF , Lyme Abs, acute hepatitis panel and HIV are negative. - negative ANCA panel - Patient started on IV doxycycline by ID, as suspicious for tick borne disease (Embedded tick, tick bite mark and abdomen?) - peripheral smear reviewed by pathologist showing activated lymphocytes and thrombocytopenia  - Elevated d-dimer (12.57)-probably an acute phase reactant in the absence of respiratory  symptoms or chest pain. Fibrinogen 426. - Significantly elevated CRP  - Requested surgery to perform punch biopsy to evaluate rash for possible vasculitis .  Abdominal pain - CT findings as above. No acute findings on abdominal exam. - Surgery input appreciated-no acute surgical needs. Recommend bowel regimen for constipation and outpatient follow-up regarding recurrent right inguinal hernia.  OSA - Continue nightly CPAP  GERD - Continue PPI  Hypertension - Patient adamantly denies and states that he only has white coat hypertension. For now monitor.  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 .  Headache/? Scalp pain - Unclear etiology. No focal deficits. Localized headache around left year/left occipital scalp. No acute findings on otoscopic exam bilaterally. - CT head: No acute findings - No meningismus. 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   Constipation - Bowel regimen.   DVT prophylaxis: SCDs Code Status: Full  Family Communication: None at bedside  Disposition Plan: DC home when medically stable.    Consultants - Infectious disease  - Neurology   procedures:  None  Antibiotics:  Doxycycline 8/1   Subjective:  reports improvement ofermittent pain at left occipital scalp & around left ear.  Objective: Filed Vitals:   06/01/15 2147 06/02/15 0215 06/02/15 0547 06/02/15 1306  BP: 142/73 158/76 170/77 147/85  Pulse: 100 97 87 81  Temp: 99.2 F (37.3 C) 98.8 F (37.1 C) 99.2 F (37.3 C) 98.5 F (36.9 C)  TempSrc: Oral Oral Oral Oral  Resp: Height:      Weight:      SpO2: 94% 93% 96% 98%     Intake/Output Summary (Last 24 hours) at 06/02/15 1613 Last data filed at 06/02/15 1029  Gross per 24 hour  Intake    240 ml  Output   1300 ml  Net  -1060 ml   Filed Weights   05/30/15 1022  Weight: 96.163 kg (212 lb)     Exam:  General exam: elderly male , comfortable No neck stiffness. Neck supple. Respiratory system: Clear. No increased work of breathing. Cardiovascular system: S1 & S2 heard, RRR. No JVD, murmurs, gallops, clicks or pedal edema. Gastrointestinal system: Abdomen is nondistended, soft and nontender. Normal bowel sounds heard. Central nervous system: Alert and oriented. No focal neurological deficits. Extremities: Symmetric 5 x 5 power. Skin: Fine petechial rash on symmetric legs mostly below the knees and to a lesser severity on the medial aspect of both thighs.      Data Reviewed: Basic Metabolic Panel:  Recent Labs Lab 05/30/15 1140 05/30/15 1151 05/31/15 0440 06/30/15 0425 06/02/15 0435  NA 136 136 138 134* 128*  K 3.9 3.8 4.3 4.1 4.0  CL 99* 96* 102 101 96*  CO2 28  --  GLUCOSE 106* 105* 101* 100* 136*  BUN 19 23* CREATININE 1.14 1.10 1.26* 1.06 1.01  CALCIUM 9.1  --  8.6* 8.6* 8.5*   Liver Function Tests:  Recent Labs Lab 05/30/15 1140 05/31/15 0440 06-30-2015 0425 06/02/15 0435  AST 70* 55* 56* 37  ALT 54 53 61 45  ALKPHOS 88 90 107 98  BILITOT 1.2 1.4* 1.4* 1.6*  PROT 6.5 6.1* 6.5 6.4*  ALBUMIN 3.3* 3.2* 3.3* 3.0*    Recent Labs Lab 05/30/15 1140  LIPASE 13*   No results for input(s): AMMONIA in the last 168 hours. CBC:  Recent Labs Lab 05/30/15 1140 05/30/15 1151 05/31/15 0440 2015/06/30 0425 06/02/15 0435  WBC 3.9*  --  4.6 5.5 6.7  NEUTROABS 3.0  --  2.8 4.3  --   HGB 14.8 15.6 12.7* 13.6 12.5*  HCT 43.2 46.0 38.3* 39.1 37.1*  MCV 88.3  --  88.5 87.9 86.3  PLT 82*  --  77* 81* 98*   Cardiac Enzymes:  Recent Labs Lab 05/30/15 1140 05/31/15 1740  CKTOTAL  --  77  TROPONINI 0.03   --    BNP (last 3 results) No results for input(s): PROBNP in the last 8760 hours. CBG: No results for input(s): GLUCAP in the last 168 hours.  Recent Results (from the past 240 hour(s))  Culture, blood (routine x 2)     Status: None (Preliminary result)   Collection Time: 2015/06/30  3:45 PM  Result Value Ref Range Status   Specimen Description BLOOD RIGHT ARM  Final   Special Requests BOTTLES DRAWN AEROBIC AND ANAEROBIC 10CC EACH  Final   Culture   Final    NO GROWTH < 24 HOURS Performed at [facility]    Report Status PENDING  Incomplete         Studies: [name] Contrast  06/30/2015   CLINICAL DATA:  Bilateral upper extremity paresthesias and weakness. Left temporal headache. Duration of symptoms 1 week.  EXAM: MRI HEAD WITHOUT AND WITH CONTRAST  MRI CERVICAL SPINE WITHOUT AND WITH CONTRAST  TECHNIQUE: Multiplanar, multiecho pulse sequences of the brain and  surrounding structures, and cervical spine, to include the craniocervical junction and cervicothoracic junction, were obtained without and with intravenous contrast.  CONTRAST:  20 cc MultiHance  COMPARISON:  Reports from examinations 2014  FINDINGS: MRI HEAD FINDINGS  Diffusion imaging does not show any acute or subacute infarction or other cause of restricted diffusion.  The brainstem and cerebellum are normal. The cerebral hemispheres show moderate chronic small-vessel ischemic changes affecting the white matter. No cortical or large vessel territory infarction. No mass lesion, hemorrhage, hydrocephalus or extra-axial collection. After contrast administration, no abnormal enhancement occurs. No pituitary mass. No inflammatory sinus disease. No skull or skullbase lesion.  MRI CERVICAL SPINE FINDINGS  No abnormal cervical cord signal. The foramen magnum is widely patent. 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  C2-3:  Mild facet degeneration.  No canal or foraminal stenosis.  C3-4: Spondylosis with  narrowing of the ventral subarachnoid space but no cord compression. Bilateral foraminal narrowing because of osteophytic encroachment.  C4-5: Bulging of the disc. Narrowing of the ventral subarachnoid space but no compression of the cord. Mild foraminal narrowing because of osteophytic encroachment.  C5-6: Spondylosis with endplate osteophytes and bulging of the disc. Effacement of the ventral subarachnoid space but no compression of the cord. Foraminal stenosis bilaterally because of osteophytic encroachment.  C6-7: Spondylosis with endplate osteophytes and bulging of the disc. Narrowing of the ventral subarachnoid space but no cord compression. Foraminal encroachment bilaterally because of osteophytes.  C7-T1: Spondylosis with endplate osteophytes and bulging of the disc. Mild narrowing of the ventral subarachnoid space. Mild foraminal narrowing on the right and moderate foraminal narrowing on the left.  T1-2:  Normal.  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 No acute or reversible process.  MRI cervical spine: Degenerative spondylosis throughout the cervical region. No cord lesion. No compressive central canal stenosis. Foraminal narrowing at C3-4, C4-5, C5-6, C6-7 and C7-T1 that could possibly cause radicular symptoms on either or both sides.   Electronically Signed   By: Paulina Fusi M.D.   On: 06/01/2015 15:53   [name] Contrast  06/01/2015   CLINICAL DATA:  Bilateral upper extremity paresthesias and weakness. Left temporal headache. Duration of symptoms 1 week.  EXAM: MRI HEAD WITHOUT AND WITH CONTRAST  MRI CERVICAL SPINE WITHOUT AND WITH CONTRAST  TECHNIQUE: Multiplanar, multiecho pulse sequences of the brain and surrounding structures, and cervical spine, to include the craniocervical junction and cervicothoracic junction, were obtained without and with intravenous contrast.  CONTRAST:  20 cc MultiHance  COMPARISON:  Reports  from examinations 2014  FINDINGS: MRI HEAD FINDINGS  Diffusion imaging does not show any acute or subacute infarction or other cause of restricted diffusion.  The brainstem and cerebellum are normal. The cerebral hemispheres show moderate chronic small-vessel ischemic changes affecting the white matter. No cortical or large vessel territory infarction. No mass lesion, hemorrhage, hydrocephalus or extra-axial collection. After contrast administration, no abnormal enhancement occurs. No pituitary mass. No inflammatory sinus disease. No skull or skullbase lesion.  MRI CERVICAL SPINE FINDINGS  No abnormal cervical cord signal. The foramen magnum is widely patent. 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  C2-3:  Mild facet degeneration.  No canal or foraminal stenosis.  C3-4: Spondylosis with narrowing of the ventral subarachnoid space but no cord compression. Bilateral foraminal narrowing because of osteophytic encroachment.  C4-5: Bulging of the disc. Narrowing of the ventral subarachnoid space but no compression of the cord. Mild foraminal narrowing  because of osteophytic encroachment.  C5-6: Spondylosis with endplate osteophytes and bulging of the disc. Effacement of the ventral subarachnoid space but no compression of the cord. Foraminal stenosis bilaterally because of osteophytic encroachment.  C6-7: Spondylosis with endplate osteophytes and bulging of the disc. Narrowing of the ventral subarachnoid space but no cord compression. Foraminal encroachment bilaterally because of osteophytes.  C7-T1: Spondylosis with endplate osteophytes and bulging of the disc. Mild narrowing of the ventral subarachnoid space. Mild foraminal narrowing on the right and moderate foraminal narrowing on the left.  T1-2:  Normal.  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 No acute or reversible process.  MRI cervical spine: Degenerative spondylosis throughout  the cervical region. No cord lesion. No compressive central canal stenosis. Foraminal narrowing at C3-4, C4-5, C5-6, C6-7 and C7-T1 that could possibly cause radicular symptoms on either or both sides.   Electronically Signed   By: Paulina Fusi M.D.   On: 06/01/2015 15:53        Scheduled Meds: . carbamazepine  200 mg Oral BID  . doxycycline (VIBRAMYCIN) IV  100 mg Intravenous Q12H  . furosemide  20 mg Intravenous Once  . pantoprazole  40 mg Oral Daily  . senna  2 tablet Oral QHS  . tamsulosin  0.4 mg Oral BID   Continuous Infusions:   Principal Problem:   Thrombocytopenia Active Problems:   Abdominal pain   Rash and nonspecific skin eruption   Hypertension   Small bowel obstruction, partial   Headache    Time spent: 30 minutes    Brita Jurgensen, MD [facility] Pager [phone]  If 7PM-7AM, please contact night-coverage [email or web address] Password [email or web address] 06/02/2015, 4:13 PM    LOS: 3 days

## 2015-06-02 NOTE — Progress Notes (Signed)
PT Cancellation Note  Patient Details Name: Leroy Morrison MRN: [id number] DOB: [birth year]   Cancelled Treatment:    Reason Eval/Treat Not Completed: Patient declined, states that he has foot problems and cannot walk without his shoes which his wife took. Nursing states that patient has been walking to the bathroom)   Rada Hay 06/02/2015, 12:24 PM Blanchard Kelch PT [phone]

## 2015-06-02 NOTE — Progress Notes (Signed)
Reported called to [place], RN on 3rd floor regarding pt status, dx,vs and meds received prior to transfer. Pt transferred to 1334 via wc accompanied by NT x 2. Wife notified via phone of transfer.

## 2015-06-02 NOTE — Progress Notes (Signed)
[facility]    Date of Admission:  05/30/2015   Total days of antibiotics 4        Day 4 doxycycline           ID: Leroy Morrison is a 74 y.o. male with chills, rash, and paresthesia Principal Problem:   Thrombocytopenia Active Problems:   Abdominal pain   Rash and nonspecific skin eruption   Hypertension   Small bowel obstruction, partial   Headache    Subjective: He states that he is feeling better, less chills, less frequent "headache", sharp shooting pain originating from left postauricular area  Medications:  . carbamazepine  200 mg Oral BID  . doxycycline (VIBRAMYCIN) IV  100 mg Intravenous Q12H  . pantoprazole  40 mg Oral Daily  . senna  2 tablet Oral QHS  . tamsulosin  0.4 mg Oral BID    Objective: Vital signs in last 24 hours: Temp:  [98.5 F (36.9 C)-99.2 F (37.3 C)] 98.5 F (36.9 C) (08/04 1306) Pulse Rate:  [81-100] 81 (08/04 1306) Resp:  [18] 18 (08/04 1306) BP: (142-170)/(73-85) 147/85 mmHg (08/04 1306) SpO2:  [93 %-98 %] 98 % (08/04 1306) Physical Exam  Constitutional: He is oriented to person, place, and time. He appears well-developed and well-nourished. No distress.  HENT:  Mouth/Throat: Oropharynx is clear and moist. No oropharyngeal exudate.  Skin: petechail rash to lower extremities is improving, still trace there Psychiatric: He has a normal mood and affect. His behavior is normal.    Lab Results  Recent Labs  06/01/15 0425 06/02/15 0435  WBC 5.5 6.7  HGB 13.6 12.5*  HCT 39.1 37.1*  NA 134* 128*  K 4.1 4.0  CL 101 96*  CO2 26 23  BUN 17 18  CREATININE 1.06 1.01   Liver Panel  Recent Labs  06/01/15 0425 06/02/15 0435  PROT 6.5 6.4*  ALBUMIN 3.3* 3.0*  AST 56* 37  ALT 61 45  ALKPHOS 107 98  BILITOT 1.4* 1.6*   Sedimentation Rate  Recent Labs  05/31/15 1740  ESRSEDRATE 31*   C-Reactive Protein  Recent Labs  05/31/15 1740  CRP 18.1*    Microbiology: RMSF is negative Lyme  disease is negative  Studies/Results: [name] Contrast  06/01/2015   CLINICAL DATA:  Bilateral upper extremity paresthesias and weakness. Left temporal headache. Duration of symptoms 1 week.  EXAM: MRI HEAD WITHOUT AND WITH CONTRAST  MRI CERVICAL SPINE WITHOUT AND WITH CONTRAST  TECHNIQUE: Multiplanar, multiecho pulse sequences of the brain and surrounding structures, and cervical spine, to include the craniocervical junction and cervicothoracic junction, were obtained without and with intravenous contrast.  CONTRAST:  20 cc MultiHance  COMPARISON:  Reports from examinations 2014  FINDINGS: MRI HEAD FINDINGS  Diffusion imaging does not show any acute or subacute infarction or other cause of restricted diffusion.  The brainstem and cerebellum are normal. The cerebral hemispheres show moderate chronic small-vessel ischemic changes affecting the white matter. No cortical or large vessel territory infarction. No mass lesion, hemorrhage, hydrocephalus or extra-axial collection. After contrast administration, no abnormal enhancement occurs. No pituitary mass. No inflammatory sinus disease. No skull or skullbase lesion.  MRI CERVICAL SPINE FINDINGS  No abnormal cervical cord signal. The foramen magnum is widely patent. 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  C2-3:  Mild facet degeneration.  No canal or foraminal stenosis.  C3-4: Spondylosis with narrowing of the ventral subarachnoid space but no cord compression. Bilateral foraminal  narrowing because of osteophytic encroachment.  C4-5: Bulging of the disc. Narrowing of the ventral subarachnoid space but no compression of the cord. Mild foraminal narrowing because of osteophytic encroachment.  C5-6: Spondylosis with endplate osteophytes and bulging of the disc. Effacement of the ventral subarachnoid space but no compression of the cord. Foraminal stenosis bilaterally because of osteophytic encroachment.  C6-7: Spondylosis with endplate  osteophytes and bulging of the disc. Narrowing of the ventral subarachnoid space but no cord compression. Foraminal encroachment bilaterally because of osteophytes.  C7-T1: Spondylosis with endplate osteophytes and bulging of the disc. Mild narrowing of the ventral subarachnoid space. Mild foraminal narrowing on the right and moderate foraminal narrowing on the left.  T1-2:  Normal.  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 No acute or reversible process.  MRI cervical spine: Degenerative spondylosis throughout the cervical region. No cord lesion. No compressive central canal stenosis. Foraminal narrowing at C3-4, C4-5, C5-6, C6-7 and C7-T1 that could possibly cause radicular symptoms on either or both sides.   Electronically Signed   By: Paulina Fusi M.D.   On: 06/01/2015 15:53   [name] Contrast  06/01/2015   CLINICAL DATA:  Bilateral upper extremity paresthesias and weakness. Left temporal headache. Duration of symptoms 1 week.  EXAM: MRI HEAD WITHOUT AND WITH CONTRAST  MRI CERVICAL SPINE WITHOUT AND WITH CONTRAST  TECHNIQUE: Multiplanar, multiecho pulse sequences of the brain and surrounding structures, and cervical spine, to include the craniocervical junction and cervicothoracic junction, were obtained without and with intravenous contrast.  CONTRAST:  20 cc MultiHance  COMPARISON:  Reports from examinations 2014  FINDINGS: MRI HEAD FINDINGS  Diffusion imaging does not show any acute or subacute infarction or other cause of restricted diffusion.  The brainstem and cerebellum are normal. The cerebral hemispheres show moderate chronic small-vessel ischemic changes affecting the white matter. No cortical or large vessel territory infarction. No mass lesion, hemorrhage, hydrocephalus or extra-axial collection. After contrast administration, no abnormal enhancement occurs. No pituitary mass. No inflammatory sinus disease. No skull or  skullbase lesion.  MRI CERVICAL SPINE FINDINGS  No abnormal cervical cord signal. The foramen magnum is widely patent. 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  C2-3:  Mild facet degeneration.  No canal or foraminal stenosis.  C3-4: Spondylosis with narrowing of the ventral subarachnoid space but no cord compression. Bilateral foraminal narrowing because of osteophytic encroachment.  C4-5: Bulging of the disc. Narrowing of the ventral subarachnoid space but no compression of the cord. Mild foraminal narrowing because of osteophytic encroachment.  C5-6: Spondylosis with endplate osteophytes and bulging of the disc. Effacement of the ventral subarachnoid space but no compression of the cord. Foraminal stenosis bilaterally because of osteophytic encroachment.  C6-7: Spondylosis with endplate osteophytes and bulging of the disc. Narrowing of the ventral subarachnoid space but no cord compression. Foraminal encroachment bilaterally because of osteophytes.  C7-T1: Spondylosis with endplate osteophytes and bulging of the disc. Mild narrowing of the ventral subarachnoid space. Mild foraminal narrowing on the right and moderate foraminal narrowing on the left.  T1-2:  Normal.  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 No acute or reversible process.  MRI cervical spine: Degenerative spondylosis throughout the cervical region. No cord lesion. No compressive central canal stenosis. Foraminal narrowing at C3-4, C4-5, C5-6, C6-7 and C7-T1 that could possibly cause radicular symptoms on either or both sides.   Electronically Signed   By: Loraine Leriche  Shogry M.D.   On: 06/01/2015 15:53     Assessment/Plan:  Leukopenia/thrombocytopenia = currently day 4 of 14 of doxy. Appears improving though RMSF and lyme ab is negative. erhlichiosis still pending  petechaiel rash = improving but unclear if completely related to low plt vs. Possibly due to other  causes such as vasculitis. Vasculitis panel is still pending  Paresthesia = still improving. Continue on with gabapentin and tegretol  Thayer Inabinet, [facility] Cell: [phone] Pager: [phone]  06/02/2015, 1:56 PM

## 2015-06-02 NOTE — Progress Notes (Signed)
Pt has refused cpap for the last three nights.  Cpap rx changed to prn.  Pt was advised that RT is available all night should he change his mind.  Rn aware.

## 2015-06-02 NOTE — Progress Notes (Signed)
Subjective: Ask to see to do skin biopsy for petechial rash Previously seen by [place] for LLQ pain, of uncertain etiology, He had a RIH without transition or evidence of ischemia.  CT contrast was in the colon the following AM. Symptoms were minimal and he was more concerned with tingling on LE and other portion of his body.  He has been seen by ID who found what might be remenants of a tick with appropriate studies.  Neurology with complaint of electric shock sensations in his lower legs, and paraesthesia's. DR. Corliss Blacker ask for a skin biopsy of rash which he thinks may be more than just from his thrombocytopenia. Objective: Vital signs in last 24 hours: Temp:  [98.5 F (36.9 C)-99.2 F (37.3 C)] 98.5 F (36.9 C) (08/04 1306) Pulse Rate:  [81-100] 81 (08/04 1306) Resp:  [18] 18 (08/04 1306) BP: (142-170)/(73-85) 147/85 mmHg (08/04 1306) SpO2:  [93 %-98 %] 98 % (08/04 1306) Last BM Date: 06/01/15  2 BM's yesterday Afebrile, VSS Na 128 Bilirubin 1.6 Thrombocytopenia platelets up to 78K  Intake/Output from previous day: 08/03 0701 - 08/04 0700 In: 490 [P.O.:240; IV Piggyback:250] Out: 1650 [Urine:1650] Intake/Output this shift: Total I/O In: 240 [P.O.:240] Out: 100 [Urine:100]  General appearance: alert, cooperative, no distress and sleeping Extremities: rash noted both lower legs.  Lab Results:   Recent Labs  06/01/15 0425 06/02/15 0435  WBC 5.5 6.7  HGB 13.6 12.5*  HCT 39.1 37.1*  PLT 81* 98*    BMET  Recent Labs  06/01/15 0425 06/02/15 0435  NA 134* 128*  K 4.1 4.0  CL 101 96*  CO2 26 23  GLUCOSE 100* 136*  BUN 17 18  CREATININE 1.06 1.01  CALCIUM 8.6* 8.5*   PT/INR No results for input(s): LABPROT, INR in the last 72 hours.   Recent Labs Lab 05/30/15 1140 05/31/15 0440 06/01/15 0425 06/02/15 0435  AST 70* 55* 56* 37  ALT 54 53 61 45  ALKPHOS 88 90 107 98  BILITOT 1.2 1.4* 1.4* 1.6*  PROT 6.5 6.1* 6.5 6.4*  ALBUMIN 3.3* 3.2* 3.3* 3.0*      Lipase     Component Value Date/Time   LIPASE 13* 05/30/2015 1140     Studies/Results: Mr Laqueta Jean WU Contrast  06/01/2015   CLINICAL DATA:  Bilateral upper extremity paresthesias and weakness. Left temporal headache. Duration of symptoms 1 week.  EXAM: MRI HEAD WITHOUT AND WITH CONTRAST  MRI CERVICAL SPINE WITHOUT AND WITH CONTRAST  TECHNIQUE: Multiplanar, multiecho pulse sequences of the brain and surrounding structures, and cervical spine, to include the craniocervical junction and cervicothoracic junction, were obtained without and with intravenous contrast.  CONTRAST:  20 cc MultiHance  COMPARISON:  Reports from examinations 2014  FINDINGS: MRI HEAD FINDINGS  Diffusion imaging does not show any acute or subacute infarction or other cause of restricted diffusion.  The brainstem and cerebellum are normal. The cerebral hemispheres show moderate chronic small-vessel ischemic changes affecting the white matter. No cortical or large vessel territory infarction. No mass lesion, hemorrhage, hydrocephalus or extra-axial collection. After contrast administration, no abnormal enhancement occurs. No pituitary mass. No inflammatory sinus disease. No skull or skullbase lesion.  MRI CERVICAL SPINE FINDINGS  No abnormal cervical cord signal. The foramen magnum is widely patent. 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  C2-3:  Mild facet degeneration.  No canal or foraminal stenosis.  C3-4: Spondylosis with narrowing of the ventral subarachnoid space but no cord compression. Bilateral foraminal narrowing because of  osteophytic encroachment.  C4-5: Bulging of the disc. Narrowing of the ventral subarachnoid space but no compression of the cord. Mild foraminal narrowing because of osteophytic encroachment.  C5-6: Spondylosis with endplate osteophytes and bulging of the disc. Effacement of the ventral subarachnoid space but no compression of the cord. Foraminal stenosis bilaterally because of  osteophytic encroachment.  C6-7: Spondylosis with endplate osteophytes and bulging of the disc. Narrowing of the ventral subarachnoid space but no cord compression. Foraminal encroachment bilaterally because of osteophytes.  C7-T1: Spondylosis with endplate osteophytes and bulging of the disc. Mild narrowing of the ventral subarachnoid space. Mild foraminal narrowing on the right and moderate foraminal narrowing on the left.  T1-2:  Normal.  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 No acute or reversible process.  MRI cervical spine: Degenerative spondylosis throughout the cervical region. No cord lesion. No compressive central canal stenosis. Foraminal narrowing at C3-4, C4-5, C5-6, C6-7 and C7-T1 that could possibly cause radicular symptoms on either or both sides.   Electronically Signed   By: Paulina Fusi M.D.   On: 06/01/2015 15:53   [name] Contrast  06/01/2015   CLINICAL DATA:  Bilateral upper extremity paresthesias and weakness. Left temporal headache. Duration of symptoms 1 week.  EXAM: MRI HEAD WITHOUT AND WITH CONTRAST  MRI CERVICAL SPINE WITHOUT AND WITH CONTRAST  TECHNIQUE: Multiplanar, multiecho pulse sequences of the brain and surrounding structures, and cervical spine, to include the craniocervical junction and cervicothoracic junction, were obtained without and with intravenous contrast.  CONTRAST:  20 cc MultiHance  COMPARISON:  Reports from examinations 2014  FINDINGS: MRI HEAD FINDINGS  Diffusion imaging does not show any acute or subacute infarction or other cause of restricted diffusion.  The brainstem and cerebellum are normal. The cerebral hemispheres show moderate chronic small-vessel ischemic changes affecting the white matter. No cortical or large vessel territory infarction. No mass lesion, hemorrhage, hydrocephalus or extra-axial collection. After contrast administration, no abnormal enhancement occurs. No  pituitary mass. No inflammatory sinus disease. No skull or skullbase lesion.  MRI CERVICAL SPINE FINDINGS  No abnormal cervical cord signal. The foramen magnum is widely patent. 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  C2-3:  Mild facet degeneration.  No canal or foraminal stenosis.  C3-4: Spondylosis with narrowing of the ventral subarachnoid space but no cord compression. Bilateral foraminal narrowing because of osteophytic encroachment.  C4-5: Bulging of the disc. Narrowing of the ventral subarachnoid space but no compression of the cord. Mild foraminal narrowing because of osteophytic encroachment.  C5-6: Spondylosis with endplate osteophytes and bulging of the disc. Effacement of the ventral subarachnoid space but no compression of the cord. Foraminal stenosis bilaterally because of osteophytic encroachment.  C6-7: Spondylosis with endplate osteophytes and bulging of the disc. Narrowing of the ventral subarachnoid space but no cord compression. Foraminal encroachment bilaterally because of osteophytes.  C7-T1: Spondylosis with endplate osteophytes and bulging of the disc. Mild narrowing of the ventral subarachnoid space. Mild foraminal narrowing on the right and moderate foraminal narrowing on the left.  T1-2:  Normal.  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 No acute or reversible process.  MRI cervical spine: Degenerative spondylosis throughout the cervical region. No cord lesion. No compressive central canal stenosis. Foraminal narrowing at C3-4, C4-5, C5-6, C6-7 and C7-T1 that could possibly cause radicular symptoms on either or both sides.   Electronically Signed   By: Scherrie Bateman.D.  On: 06/01/2015 15:53    Medications: . carbamazepine  200 mg Oral BID  . doxycycline (VIBRAMYCIN) IV  100 mg Intravenous Q12H  . pantoprazole  40 mg Oral Daily  . senna  2 tablet Oral QHS  . tamsulosin  0.4 mg Oral BID     Assessment/Plan Thrombocytopenia with rash Paresthesia/neuropathic pain LLQ pain resolved/RIH Hypertension   Plan:  I will plan to do biopsy tomorrow.      LOS: 3 days    Leroy Morrison 06/02/2015

## 2015-06-02 NOTE — Progress Notes (Addendum)
Subjective: Continues to have episodic pain centered around his ear.  He has had some relief since yesterday, but it is still severe.   Exam: Filed Vitals:   06/02/15 0547  BP: 170/77  Pulse: 87  Temp: 99.2 F (37.3 C)  Resp: 18   Gen: In bed, NAD Resp: non-labored breathing, no acute distress Abd: soft, nt  Neuro: MS: awake and alert, appropriate   KL:KJZP, PERRL Motor: No pronator drift.  Sensory:intact to LT  Pertinent Labs: Negative lyme and RMSF titers Erlichia pending.   Impression: 74 yo M with periauricular pain in the setting of other systemic symptoms including petechial rash, subjective fevers, elevated CRP and less so ESR. GCA may still need to be considered. I typically do not think of petechial rash just from thrombocytopenia with platelets in the 70s and wonder if a biopsy of this rash could be helpful to determining the diagnosis.   I would continue tegretol, if continues to have pain tomorrow, could increase this to 459m BID. 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 Could also be possible. I am not at all certain there is any primary neurological cause.   Recommendations: 1) Awaiting ANCA, Mpo/pr3 ab, Erlichia 2) Continue tegretol 3) If erlichia is negative, would consider possible further evaluation for vasculitis, ? Rash biopsy.  4) If patient is still here on Monday, we will check back in, otherwise neurology will be available over the weekend on an as needed basis.   MRoland Rack MD [facility] [phone] If 7pm- 7am, please page neurology on call as listed in [place]

## 2015-06-02 NOTE — Clinical Documentation Improvement (Signed)
  Based on your independent clinical judgment, can you provide a diagnosis that represents the below-listed clinical indicators? Possible Clinical Conditions? Please answer in progress notes and carry forward to Discharge Summary.     Hyponatremia Hypernatremia SIADH Normal sodium values                               Other Condition___________________                 Cannot Clinically Determine_________   Supporting Information: Lab has reported the following sodium values for this patient: 128, 134, 138, 136   If you choose to use possible, probable, suspected diagnoses it MUST be documented at the time of discharge  Thank You, Alesia Richards, RN CDS [facility] Health Information Management [email or web address] [phone]

## 2015-06-03 LAB — CBC WITH DIFFERENTIAL/PLATELET
Basophils Absolute: 0 10*3/uL (ref 0.0–0.1)
Basophils Relative: 1 % (ref 0–1)
EOS PCT: 1 % (ref 0–5)
Eosinophils Absolute: 0.1 10*3/uL (ref 0.0–0.7)
HCT: 37.3 % — ABNORMAL LOW (ref 39.0–52.0)
Hemoglobin: 12.9 g/dL — ABNORMAL LOW (ref 13.0–17.0)
Lymphocytes Relative: 10 % — ABNORMAL LOW (ref 12–46)
Lymphs Abs: 0.7 10*3/uL (ref 0.7–4.0)
MCH: 29.9 pg (ref 26.0–34.0)
MCHC: 34.6 g/dL (ref 30.0–36.0)
MCV: 86.5 fL (ref 78.0–100.0)
MONO ABS: 0.6 10*3/uL (ref 0.1–1.0)
Monocytes Relative: 8 % (ref 3–12)
NEUTROS PCT: 80 % — AB (ref 43–77)
Neutro Abs: 5.8 10*3/uL (ref 1.7–7.7)
PLATELETS: 137 10*3/uL — AB (ref 150–400)
RBC: 4.31 MIL/uL (ref 4.22–5.81)
RDW: 12.8 % (ref 11.5–15.5)
WBC: 7.2 10*3/uL (ref 4.0–10.5)

## 2015-06-03 LAB — BASIC METABOLIC PANEL
Anion gap: 11 (ref 5–15)
BUN: 24 mg/dL — AB (ref 6–20)
CHLORIDE: 92 mmol/L — AB (ref 101–111)
CO2: 24 mmol/L (ref 22–32)
Calcium: 8.4 mg/dL — ABNORMAL LOW (ref 8.9–10.3)
Creatinine, Ser: 1.05 mg/dL (ref 0.61–1.24)
GFR calc Af Amer: >60 mL/min (ref >60)
GFR calc non Af Amer: >60 mL/min (ref >60)
GLUCOSE: 144 mg/dL — AB (ref 65–99)
Potassium: 3.5 mmol/L (ref 3.5–5.1)
Sodium: 127 mmol/L — ABNORMAL LOW (ref 135–145)

## 2015-06-03 LAB — EHRLICHIA ANTIBODY PANEL
E chaffeensis (HGE) Ab, IgG: NEGATIVE
E chaffeensis (HGE) Ab, IgM: NEGATIVE
E. Chaffeensis (HME) IgM Titer: NEGATIVE
E.Chaffeensis (HME) IgG: NEGATIVE

## 2015-06-03 MED ORDER — SENNOSIDES-DOCUSATE SODIUM 8.6-50 MG PO TABS
2.0000 | ORAL_TABLET | Freq: Two times a day (BID) | ORAL | Status: DC
  Administered 2015-06-03 – 2015-06-04 (×2): 2 via ORAL
  Filled 2015-06-03 (×3): qty 2

## 2015-06-03 MED ORDER — SODIUM CHLORIDE 1 G PO TABS
2.0000 g | ORAL_TABLET | Freq: Once | ORAL | Status: AC
  Administered 2015-06-03: 2 g via ORAL
  Filled 2015-06-03: qty 2

## 2015-06-03 MED ORDER — HEPARIN SODIUM (PORCINE) 5000 UNIT/ML IJ SOLN
5000.0000 [IU] | Freq: Three times a day (TID) | INTRAMUSCULAR | Status: DC
  Administered 2015-06-03 – 2015-06-04 (×3): 5000 [IU] via SUBCUTANEOUS
  Filled 2015-06-03 (×6): qty 1

## 2015-06-03 NOTE — Progress Notes (Signed)
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 Patient was sent to [facility] as a direct admit to the hospitalist service. Surgery evaluated and signed off-no acute abdominal issues. ID consulted for evaluation of infectious etiology as cause for his presentation.   Assessment/Plan:  Thrombocytopenia with petechial rash - Unclear etiology. No prior lab work to know chronicity of same. As per EDP note, patient had platelet count of 100, 4 days pta. - DD-infectious etiology (RMSF, Ehrlichiosis, Lyme's. Note mild leukopenia and mild transaminitis), primary blood disorder, autoimmune including vasculitis, medications versus other etiology. - Infectious disease consult greatly appreciated,  Ehrlichia panel still pending, RMSF , Lyme Abs, acute hepatitis panel and HIV are negative. - negative ANCA panel - Patient started on IV doxycycline by ID, as suspicious for tick borne disease (Embedded tick, tick bite mark and abdomen?) - peripheral smear reviewed by pathologist showing activated lymphocytes and thrombocytopenia  - Elevated d-dimer (12.57)-probably an acute phase reactant in the absence of respiratory  symptoms or chest pain. Fibrinogen 426. - Significantly elevated CRP  - Requested surgery to perform punch biopsy to evaluate rash for possible vasculitis .  - Thrombocytopenia improving, leukopenia resolved, start on subcutaneous heparin for DVT prophylaxis  Abdominal pain - CT findings as above. No acute findings on abdominal exam. - Surgery input appreciated-no acute surgical needs. Recommend bowel regimen for constipation and outpatient follow-up regarding recurrent right inguinal hernia.  OSA - Continue nightly CPAP  GERD - Continue PPI  Hypertension - Patient adamantly denies and states that he only has white coat hypertension. For now monitor.  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  Headache/? Scalp pain - Unclear etiology. No focal deficits. Localized headache around left year/left occipital scalp. No acute findings on otoscopic exam bilaterally. - CT head: No acute findings - No meningismus. 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  Constipation - Bowel regimen.   DVT prophylaxis: SCDs Code Status: Full  Family Communication: Wife at bedside  Disposition Plan: DC home when medically stable.    Consultants - Infectious disease  - Neurology   procedures:  None  Antibiotics:  Doxycycline 8/1   Subjective:  reports improvement ofermittent pain at left occipital scalp & around left ear.  Objective: Filed Vitals:   06/02/15 0547 06/02/15 1306 06/02/15 2200 06/03/15 0546  BP: 170/77 147/85 141/78 147/88  Pulse: 87 81  87 94  Temp:  99.2 F (37.3 C) 98.5 F (36.9 C) 98.4 F (36.9 C) 97.7 F (36.5 C)  TempSrc: Oral Oral Oral Oral  Resp: 18 18 18 18   Height:      Weight:      SpO2: 96% 98% 97% 95%    Intake/Output Summary (Last 24 hours) at 06/03/15 1310 Last data filed at 06/03/15 1101  Gross per 24 hour  Intake    960 ml  Output    750 ml  Net    210 ml   Filed Weights   05/30/15 1022  Weight: 96.163 kg (212 lb)     Exam:  General exam: elderly male , comfortable No neck stiffness. Neck supple. Respiratory system: Clear. No increased work of breathing. Cardiovascular system: S1 & S2 heard, RRR. No JVD, murmurs, gallops, clicks or pedal edema. Gastrointestinal system: Abdomen is nondistended, soft and nontender. Normal bowel sounds heard. Central nervous system: Alert and oriented. No focal neurological deficits. Extremities: Symmetric 5 x 5 power. Skin: Fine petechial rash on symmetric legs mostly below the knees and to a lesser severity on the medial aspect of both thighs.      Data Reviewed: Basic Metabolic Panel:  Recent Labs Lab 05/30/15 1140 05/30/15 1151 05/31/15 0440 2015-06-22 0425 06/02/15 0435 06/03/15 0418  NA 136 136 138 134* 128* 127*  K 3.9 3.8 4.3 4.1 4.0 3.5  CL 99* 96* 102 101 96* 92*  CO2 28  --  28 26 23 24   GLUCOSE 106* 105* 101* 100* 136* 144*  BUN 19 23* 19 17 18  24*  CREATININE 1.14 1.10 1.26* 1.06 1.01 1.05  CALCIUM 9.1  --  8.6* 8.6* 8.5* 8.4*   Liver Function Tests:  Recent Labs Lab 05/30/15 1140 05/31/15 0440 2015-06-22 0425 06/02/15 0435  AST 70* 55* 56* 37  ALT 54 53 61 45  ALKPHOS 88 90 107 98  BILITOT 1.2 1.4* 1.4* 1.6*  PROT 6.5 6.1* 6.5 6.4*  ALBUMIN 3.3* 3.2* 3.3* 3.0*    Recent Labs Lab 05/30/15 1140  LIPASE 13*   No results for input(s): AMMONIA in the last 168 hours. CBC:  Recent Labs Lab 05/30/15 1140 05/30/15 1151 05/31/15 0440 2015-06-22 0425 06/02/15 0435 06/03/15 0418  WBC 3.9*  --  4.6 5.5 6.7 7.2  NEUTROABS 3.0  --   2.8 4.3  --  5.8  HGB 14.8 15.6 12.7* 13.6 12.5* 12.9*  HCT 43.2 46.0 38.3* 39.1 37.1* 37.3*  MCV 88.3  --  88.5 87.9 86.3 86.5  PLT 82*  --  77* 81* 98* 137*   Cardiac Enzymes:  Recent Labs Lab 05/30/15 1140 05/31/15 1740  CKTOTAL  --  77  TROPONINI 0.03  --    BNP (last 3 results) No results for input(s): PROBNP in the last 8760 hours. CBG: No results for input(s): GLUCAP in the last 168 hours.  Recent Results (from the past 240 hour(s))  Culture, blood (routine x 2)     Status: None (Preliminary result)   Collection Time: 06-22-15  3:45 PM  Result Value Ref Range Status   Specimen Description BLOOD RIGHT ARM  Final   Special Requests BOTTLES DRAWN AEROBIC AND ANAEROBIC 10CC EACH  Final   Culture   Final    NO GROWTH < 24 HOURS Performed at [facility]    Report Status PENDING  Incomplete         Studies: Mr Lodema Pilot Contrast  06-22-2015   CLINICAL  DATA:  Bilateral upper extremity paresthesias and weakness. Left temporal headache. Duration of symptoms 1 week.  EXAM: MRI HEAD WITHOUT AND WITH CONTRAST  MRI CERVICAL SPINE WITHOUT AND WITH CONTRAST  TECHNIQUE: Multiplanar, multiecho pulse sequences of the brain and surrounding structures, and cervical spine, to include the craniocervical junction and cervicothoracic junction, were obtained without and with intravenous contrast.  CONTRAST:  20 cc MultiHance  COMPARISON:  Reports from examinations 2014  FINDINGS: MRI HEAD FINDINGS  Diffusion imaging does not show any acute or subacute infarction or other cause of restricted diffusion.  The brainstem and cerebellum are normal. The cerebral hemispheres show moderate chronic small-vessel ischemic changes affecting the white matter. No cortical or large vessel territory infarction. No mass lesion, hemorrhage, hydrocephalus or extra-axial collection. After contrast administration, no abnormal enhancement occurs. No pituitary mass. No inflammatory sinus disease. No skull or  skullbase lesion.  MRI CERVICAL SPINE FINDINGS  No abnormal cervical cord signal. The foramen magnum is widely patent. 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  C2-3:  Mild facet degeneration.  No canal or foraminal stenosis.  C3-4: Spondylosis with narrowing of the ventral subarachnoid space but no cord compression. Bilateral foraminal narrowing because of osteophytic encroachment.  C4-5: Bulging of the disc. Narrowing of the ventral subarachnoid space but no compression of the cord. Mild foraminal narrowing because of osteophytic encroachment.  C5-6: Spondylosis with endplate osteophytes and bulging of the disc. Effacement of the ventral subarachnoid space but no compression of the cord. Foraminal stenosis bilaterally because of osteophytic encroachment.  C6-7: Spondylosis with endplate osteophytes and bulging of the disc. Narrowing of the ventral subarachnoid space but no cord compression. Foraminal encroachment bilaterally because of osteophytes.  C7-T1: Spondylosis with endplate osteophytes and bulging of the disc. Mild narrowing of the ventral subarachnoid space. Mild foraminal narrowing on the right and moderate foraminal narrowing on the left.  T1-2:  Normal.  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 No acute or reversible process.  MRI cervical spine: Degenerative spondylosis throughout the cervical region. No cord lesion. No compressive central canal stenosis. Foraminal narrowing at C3-4, C4-5, C5-6, C6-7 and C7-T1 that could possibly cause radicular symptoms on either or both sides.   Electronically Signed   By: Paulina Fusi M.D.   On: 06/01/2015 15:53   [name] Contrast  06/01/2015   CLINICAL DATA:  Bilateral upper extremity paresthesias and weakness. Left temporal headache. Duration of symptoms 1 week.  EXAM: MRI HEAD WITHOUT AND WITH CONTRAST  MRI CERVICAL SPINE WITHOUT AND WITH CONTRAST  TECHNIQUE:  Multiplanar, multiecho pulse sequences of the brain and surrounding structures, and cervical spine, to include the craniocervical junction and cervicothoracic junction, were obtained without and with intravenous contrast.  CONTRAST:  20 cc MultiHance  COMPARISON:  Reports from examinations 2014  FINDINGS: MRI HEAD FINDINGS  Diffusion imaging does not show any acute or subacute infarction or other cause of restricted diffusion.  The brainstem and cerebellum are normal. The cerebral hemispheres show moderate chronic small-vessel ischemic changes affecting the white matter. No cortical or large vessel territory infarction. No mass lesion, hemorrhage, hydrocephalus or extra-axial collection. After contrast administration, no abnormal enhancement occurs. No pituitary mass. No inflammatory sinus disease. No skull or skullbase lesion.  MRI CERVICAL SPINE FINDINGS  No abnormal cervical cord signal. The foramen magnum is widely patent. 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  C2-3:  Mild facet degeneration.  No canal or foraminal stenosis.  C3-4: Spondylosis with narrowing of the ventral subarachnoid space but no cord compression. Bilateral foraminal narrowing because of osteophytic encroachment.  C4-5: Bulging of the disc. Narrowing of the ventral subarachnoid space but no compression of the cord. Mild foraminal narrowing because of osteophytic encroachment.  C5-6: Spondylosis with endplate osteophytes and bulging of the disc. Effacement of the ventral subarachnoid space but no compression of the cord. Foraminal stenosis bilaterally because of osteophytic encroachment.  C6-7: Spondylosis with endplate osteophytes and bulging of the disc. Narrowing of the ventral subarachnoid space but no cord compression. Foraminal encroachment bilaterally because of osteophytes.  C7-T1: Spondylosis with endplate osteophytes and bulging of the disc. Mild narrowing of the ventral subarachnoid space. Mild foraminal  narrowing on the right and moderate foraminal narrowing on the left.  T1-2:  Normal.  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 No acute or reversible process.  MRI cervical spine: Degenerative spondylosis throughout the cervical region. No cord lesion. No compressive central canal stenosis. Foraminal narrowing at C3-4, C4-5, C5-6, C6-7 and C7-T1 that could possibly cause radicular symptoms on either or both sides.   Electronically Signed   By: Paulina Fusi M.D.   On: 06/01/2015 15:53        Scheduled Meds: . carbamazepine  200 mg Oral BID  . doxycycline (VIBRAMYCIN) IV  100 mg Intravenous Q12H  . heparin subcutaneous  5,000 Units Subcutaneous 3 times per day  . naproxen  500 mg Oral BID WC  . pantoprazole  40 mg Oral Daily  . senna  2 tablet Oral QHS  . sodium chloride  2 g Oral Once  . tamsulosin  0.4 mg Oral BID   Continuous Infusions:   Principal Problem:   Thrombocytopenia Active Problems:   Abdominal pain   Rash and nonspecific skin eruption   Hypertension   Small bowel obstruction, partial   Headache    Time spent: 30 minutes    Pritika Alvarez, MD [facility] Pager [phone]  If 7PM-7AM, please contact night-coverage [email or web address] Password [email or web address] 06/03/2015, 1:10 PM    LOS: 4 days

## 2015-06-03 NOTE — Progress Notes (Signed)
Subjective: Skin rash punch biopsy today   Objective: Vital signs in last 24 hours: Temp:  [97.7 F (36.5 C)-98.4 F (36.9 C)] 97.7 F (36.5 C) (08/05 0546) Pulse Rate:  [87-94] 94 (08/05 0546) Resp:  [18] 18 (08/05 0546) BP: (141-147)/(78-88) 147/88 mmHg (08/05 0546) SpO2:  [95 %-97 %] 95 % (08/05 0546) Last BM Date: 06/01/15  Intake/Output from previous day: 08/04 0701 - 08/05 0700 In: 480 [P.O.:480] Out: 650 [Urine:650] Intake/Output this shift: Total I/O In: 720 [P.O.:720] Out: 200 [Urine:200]  General appearance: alert, cooperative and no distress Resp: clear to auscultation bilaterally GI: soft, non-tender; bowel sounds normal; no masses,  no organomegaly Skin: 3 mm punch biopsy made at site designated by Dr. Randol Kern.  Lab Results:   Recent Labs  06/02/15 0435 06/03/15 0418  WBC 6.7 7.2  HGB 12.5* 12.9*  HCT 37.1* 37.3*  PLT 98* 137*    BMET  Recent Labs  06/02/15 0435 06/03/15 0418  NA 128* 127*  K 4.0 3.5  CL 96* 92*  CO2 23 24  GLUCOSE 136* 144*  BUN 18 24*  CREATININE 1.01 1.05  CALCIUM 8.5* 8.4*   PT/INR No results for input(s): LABPROT, INR in the last 72 hours.   Recent Labs Lab 05/30/15 1140 05/31/15 0440 06/01/15 0425 06/02/15 0435  AST 70* 55* 56* 37  ALT 54 53 61 45  ALKPHOS 88 90 107 98  BILITOT 1.2 1.4* 1.4* 1.6*  PROT 6.5 6.1* 6.5 6.4*  ALBUMIN 3.3* 3.2* 3.3* 3.0*     Lipase     Component Value Date/Time   LIPASE 13* 05/30/2015 1140     Studies/Results: Mr Laqueta Jean ZO Contrast  06/01/2015   CLINICAL DATA:  Bilateral upper extremity paresthesias and weakness. Left temporal headache. Duration of symptoms 1 week.  EXAM: MRI HEAD WITHOUT AND WITH CONTRAST  MRI CERVICAL SPINE WITHOUT AND WITH CONTRAST  TECHNIQUE: Multiplanar, multiecho pulse sequences of the brain and surrounding structures, and cervical spine, to include the craniocervical junction and cervicothoracic junction, were obtained without and with  intravenous contrast.  CONTRAST:  20 cc MultiHance  COMPARISON:  Reports from examinations 2014  FINDINGS: MRI HEAD FINDINGS  Diffusion imaging does not show any acute or subacute infarction or other cause of restricted diffusion.  The brainstem and cerebellum are normal. The cerebral hemispheres show moderate chronic small-vessel ischemic changes affecting the white matter. No cortical or large vessel territory infarction. No mass lesion, hemorrhage, hydrocephalus or extra-axial collection. After contrast administration, no abnormal enhancement occurs. No pituitary mass. No inflammatory sinus disease. No skull or skullbase lesion.  MRI CERVICAL SPINE FINDINGS  No abnormal cervical cord signal. The foramen magnum is widely patent. 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  C2-3:  Mild facet degeneration.  No canal or foraminal stenosis.  C3-4: Spondylosis with narrowing of the ventral subarachnoid space but no cord compression. Bilateral foraminal narrowing because of osteophytic encroachment.  C4-5: Bulging of the disc. Narrowing of the ventral subarachnoid space but no compression of the cord. Mild foraminal narrowing because of osteophytic encroachment.  C5-6: Spondylosis with endplate osteophytes and bulging of the disc. Effacement of the ventral subarachnoid space but no compression of the cord. Foraminal stenosis bilaterally because of osteophytic encroachment.  C6-7: Spondylosis with endplate osteophytes and bulging of the disc. Narrowing of the ventral subarachnoid space but no cord compression. Foraminal encroachment bilaterally because of osteophytes.  C7-T1: Spondylosis with endplate osteophytes and bulging of the disc. Mild narrowing of the ventral  subarachnoid space. Mild foraminal narrowing on the right and moderate foraminal narrowing on the left.  T1-2:  Normal.  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 No acute or  reversible process.  MRI cervical spine: Degenerative spondylosis throughout the cervical region. No cord lesion. No compressive central canal stenosis. Foraminal narrowing at C3-4, C4-5, C5-6, C6-7 and C7-T1 that could possibly cause radicular symptoms on either or both sides.   Electronically Signed   By: Paulina Fusi M.D.   On: 06/01/2015 15:53   [name] Contrast  06/01/2015   CLINICAL DATA:  Bilateral upper extremity paresthesias and weakness. Left temporal headache. Duration of symptoms 1 week.  EXAM: MRI HEAD WITHOUT AND WITH CONTRAST  MRI CERVICAL SPINE WITHOUT AND WITH CONTRAST  TECHNIQUE: Multiplanar, multiecho pulse sequences of the brain and surrounding structures, and cervical spine, to include the craniocervical junction and cervicothoracic junction, were obtained without and with intravenous contrast.  CONTRAST:  20 cc MultiHance  COMPARISON:  Reports from examinations 2014  FINDINGS: MRI HEAD FINDINGS  Diffusion imaging does not show any acute or subacute infarction or other cause of restricted diffusion.  The brainstem and cerebellum are normal. The cerebral hemispheres show moderate chronic small-vessel ischemic changes affecting the white matter. No cortical or large vessel territory infarction. No mass lesion, hemorrhage, hydrocephalus or extra-axial collection. After contrast administration, no abnormal enhancement occurs. No pituitary mass. No inflammatory sinus disease. No skull or skullbase lesion.  MRI CERVICAL SPINE FINDINGS  No abnormal cervical cord signal. The foramen magnum is widely patent. 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  C2-3:  Mild facet degeneration.  No canal or foraminal stenosis.  C3-4: Spondylosis with narrowing of the ventral subarachnoid space but no cord compression. Bilateral foraminal narrowing because of osteophytic encroachment.  C4-5: Bulging of the disc. Narrowing of the ventral subarachnoid space but no compression of the  cord. Mild foraminal narrowing because of osteophytic encroachment.  C5-6: Spondylosis with endplate osteophytes and bulging of the disc. Effacement of the ventral subarachnoid space but no compression of the cord. Foraminal stenosis bilaterally because of osteophytic encroachment.  C6-7: Spondylosis with endplate osteophytes and bulging of the disc. Narrowing of the ventral subarachnoid space but no cord compression. Foraminal encroachment bilaterally because of osteophytes.  C7-T1: Spondylosis with endplate osteophytes and bulging of the disc. Mild narrowing of the ventral subarachnoid space. Mild foraminal narrowing on the right and moderate foraminal narrowing on the left.  T1-2:  Normal.  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 No acute or reversible process.  MRI cervical spine: Degenerative spondylosis throughout the cervical region. No cord lesion. No compressive central canal stenosis. Foraminal narrowing at C3-4, C4-5, C5-6, C6-7 and C7-T1 that could possibly cause radicular symptoms on either or both sides.   Electronically Signed   By: Paulina Fusi M.D.   On: 06/01/2015 15:53    Medications: . carbamazepine  200 mg Oral BID  . doxycycline (VIBRAMYCIN) IV  100 mg Intravenous Q12H  . heparin subcutaneous  5,000 Units Subcutaneous 3 times per day  . naproxen  500 mg Oral BID WC  . pantoprazole  40 mg Oral Daily  . senna-docusate  2 tablet Oral BID  . sodium chloride  2 g Oral Once  . tamsulosin  0.4 mg Oral BID    Assessment/Plan Thrombocytopenia with rash Paresthesia/neuropathic pain LLQ pain resolved/RIH Hypertension    Plan/Procedure:  Risk and  benefits discussed. Permit for RLE skin biopsy obtained. Area marked RLE cleaned with betadine swab x 3 Area injected with 2 cc lidocaine 2% plain. A 3 mm skin punch biopsy performed without issue, no discomfort noted from the patiient. Hemostasis obtained with direct  pressure. Site dressed with 2 x 2, and Band-Aid.   Skin biopsy to pathology    LOS: 4 days    Chani Ghanem 06/03/2015

## 2015-06-03 NOTE — Evaluation (Signed)
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 Patient was sent to [facility] as a direct admit to the hospitalist service. Surgery evaluated and signed off-no acute abdominal issues. ID consulted for evaluation of infectious etiology as cause for his presentation.  Clinical Impression  Patient stated" I seem to have trouble finding my words". Patient c/o L temporal/frontal sharp  pain several times while PT with patient. Patient will benefit from PT to address problems listed in note below.   Follow Up Recommendations Home health PT;Supervision - Intermittent    Equipment Recommendations  None recommended by PT    Recommendations for Other Services       Precautions / Restrictions Precautions Precautions: Fall      Mobility  Bed Mobility Overal bed mobility: Needs Assistance Bed Mobility: Supine to Sit     Supine to sit: Supervision        Transfers Overall transfer level: Needs assistance Equipment used: Rolling walker (2 wheeled) Transfers: Sit to/from Stand Sit to Stand: Min guard         General transfer comment: cues for  safety  Ambulation/Gait Ambulation/Gait assistance: Min assist Ambulation Distance (Feet): 200 Feet (with RW)   Gait Pattern/deviations: Step-through pattern     General Gait Details: 200' without RW  Stairs            Wheelchair Mobility    Modified Rankin (Stroke Patients Only)       Balance Overall balance assessment: Needs assistance Sitting-balance support: No upper extremity supported;Feet supported Sitting balance-Leahy Scale: Normal     Standing balance support: During functional activity;No upper extremity supported Standing balance-Leahy Scale: Fair                               Pertinent Vitals/Pain Pain Assessment: 0-10 Pain Score: 6  Pain Location: L temple Pain Descriptors / Indicators: Throbbing;Spasm    Home Living Family/patient expects to be discharged to:: Private residence Living Arrangements: Spouse/significant other Available Help at Discharge: Family Type of Home: House Home Access: Ramped entrance     Home Layout: One level Home Equipment: None      Prior Function Level of Independence: Independent         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     Hand Dominance        Extremity/Trunk Assessment   Upper Extremity Assessment: Overall WFL for tasks assessed           Lower Extremity Assessment: Overall WFL for tasks assessed      Cervical / Trunk Assessment: Normal  Communication   Communication: Expressive difficulties  Cognition Arousal/Alertness: Awake/alert Behavior During Therapy: WFL for tasks assessed/performed Overall Cognitive Status:  Impaired/Different from baseline Area of Impairment: Problem solving Orientation Level: Disoriented to;Time             General Comments: patient states that he feels like he has difficulty finding words. possible decreased problem solving    General Comments      Exercises        Assessment/Plan    PT Assessment Patient  needs continued PT services  PT Diagnosis Difficulty walking;Generalized weakness   PT Problem List Decreased strength;Decreased activity tolerance;Decreased mobility;Pain  PT Treatment Interventions Gait training;Functional mobility training;Therapeutic activities;Therapeutic exercise;Patient/family education;Balance training   PT Goals (Current goals can be found in the Care Plan section) Acute Rehab PT Goals Patient Stated Goal: to find out what is wrong PT Goal Formulation: With patient/family Time For Goal Achievement: 06/17/15 Potential to Achieve Goals: Good    Frequency Min 3X/week   Barriers to discharge        Co-evaluation               End of Session Equipment Utilized During Treatment: Gait belt Activity Tolerance: Patient tolerated treatment well Patient left: in chair;with call bell/phone within reach;with family/visitor present Nurse Communication: Mobility status         Time: 1610-9604 PT Time Calculation (min) (ACUTE ONLY): 16 min   Charges:   PT Evaluation $Initial PT Evaluation Tier I: 1 Procedure     PT G CodesRada Hay 06/03/2015, 12:52 PM Blanchard Kelch PT [phone]

## 2015-06-03 NOTE — Care Management Important Message (Signed)
Important Message  Patient Details  Name: Leroy Morrison MRN: [id number] Date of Birth: 06-17-1941   Medicare Important Message Given:  Yes-second notification given    Haskell Flirt 06/03/2015, 12:13 PMImportant Message  Patient Details  Name: Leroy Morrison MRN: [id number] Date of Birth: Oct 20, 1941   Medicare Important Message Given:  Yes-second notification given    Haskell Flirt 06/03/2015, 12:13 PM

## 2015-06-04 LAB — CBC
HCT: 35.9 % — ABNORMAL LOW (ref 39.0–52.0)
Hemoglobin: 12.3 g/dL — ABNORMAL LOW (ref 13.0–17.0)
MCH: 29.4 pg (ref 26.0–34.0)
MCHC: 34.3 g/dL (ref 30.0–36.0)
MCV: 85.7 fL (ref 78.0–100.0)
PLATELETS: 154 10*3/uL (ref 150–400)
RBC: 4.19 MIL/uL — ABNORMAL LOW (ref 4.22–5.81)
RDW: 12.9 % (ref 11.5–15.5)
WBC: 5.9 10*3/uL (ref 4.0–10.5)

## 2015-06-04 LAB — BASIC METABOLIC PANEL
Anion gap: 10 (ref 5–15)
BUN: 25 mg/dL — AB (ref 6–20)
CHLORIDE: 96 mmol/L — AB (ref 101–111)
CO2: 24 mmol/L (ref 22–32)
CREATININE: 1.07 mg/dL (ref 0.61–1.24)
Calcium: 8.6 mg/dL — ABNORMAL LOW (ref 8.9–10.3)
GFR calc Af Amer: >60 mL/min (ref >60)
GFR calc non Af Amer: >60 mL/min (ref >60)
GLUCOSE: 112 mg/dL — AB (ref 65–99)
Potassium: 3.8 mmol/L (ref 3.5–5.1)
Sodium: 130 mmol/L — ABNORMAL LOW (ref 135–145)

## 2015-06-04 MED ORDER — ACETAMINOPHEN-CODEINE #3 300-30 MG PO TABS
1.0000 | ORAL_TABLET | ORAL | Status: DC | PRN
  Administered 2015-06-04: 1 via ORAL
  Filled 2015-06-04: qty 1

## 2015-06-04 MED ORDER — DOXYCYCLINE HYCLATE 100 MG PO TABS: 100.0000 mg | ORAL_TABLET | Freq: Two times a day (BID) | ORAL | Status: AC

## 2015-06-04 MED ORDER — CARBAMAZEPINE ER 200 MG PO TB12: 200.0000 mg | ORAL_TABLET | Freq: Two times a day (BID) | ORAL | Status: DC

## 2015-06-04 MED ORDER — DOXYCYCLINE HYCLATE 100 MG PO TABS
100.0000 mg | ORAL_TABLET | Freq: Two times a day (BID) | ORAL | Status: DC
  Filled 2015-06-04: qty 1

## 2015-06-04 MED ORDER — ACETAMINOPHEN-CODEINE #3 300-30 MG PO TABS: 1.0000 | ORAL_TABLET | ORAL | Status: DC | PRN

## 2015-06-04 NOTE — Progress Notes (Signed)
RN spoke to pt regarding need for home health PT, patient states he does not want or need home health PT.

## 2015-06-04 NOTE — Progress Notes (Signed)
Patient discharged home, all discharge medications and instructions reviewed and questions answered. Patient to be assisted to vehicle by wheelchair.  

## 2015-06-04 NOTE — Discharge Instructions (Signed)
Follow with Primary MD Karle Plumber, MD in 7 days   Get CBC, CMP, 2 view Chest X ray checked  by Primary MD next visit.    Activity: As tolerated with Full fall precautions use walker/cane & assistance as needed   Disposition Home    Diet: Heart Healthy  , with feeding assistance and aspiration precautions.  For Heart failure patients - Check your Weight same time everyday, if you gain over 2 pounds, or you develop in leg swelling, experience more shortness of breath or chest pain, call your Primary MD immediately. Follow Cardiac Low Salt Diet and 1.5 lit/day fluid restriction.   On your next visit with your primary care physician please Get Medicines reviewed and adjusted.   Please request your Prim.MD to go over all Hospital Tests and Procedure/Radiological results at the follow up, please get all Hospital records sent to your Prim MD by signing hospital release before you go home.   If you experience worsening of your admission symptoms, develop shortness of breath, life threatening emergency, suicidal or homicidal thoughts you must seek medical attention immediately by calling 911 or calling your MD immediately  if symptoms less severe.  You Must read complete instructions/literature along with all the possible adverse reactions/side effects for all the Medicines you take and that have been prescribed to you. Take any new Medicines after you have completely understood and accpet all the possible adverse reactions/side effects.   Do not drive, operating heavy machinery, perform activities at heights, swimming or participation in water activities or provide baby sitting services if your were admitted for syncope or siezures until you have seen by Primary MD or a Neurologist and advised to do so again.  Do not drive when taking Pain medications.    Do not take more than prescribed Pain, Sleep and Anxiety Medications  Special Instructions: If you have smoked or chewed Tobacco  in  the last 2 yrs please stop smoking, stop any regular Alcohol  and or any Recreational drug use.  Wear Seat belts while driving.   Please note  You were cared for by a hospitalist during your hospital stay. If you have any questions about your discharge medications or the care you received while you were in the hospital after you are discharged, you can call the unit and asked to speak with the hospitalist on call if the hospitalist that took care of you is not available. Once you are discharged, your primary care physician will handle any further medical issues. Please note that NO REFILLS for any discharge medications will be authorized once you are discharged, as it is imperative that you return to your primary care physician (or establish a relationship with a primary care physician if you do not have one) for your aftercare needs so that they can reassess your need for medications and monitor your lab values.

## 2015-06-04 NOTE — Discharge Summary (Signed)
Leroy Morrison, is a 74 y.o. male  DOB 1941/09/15  MRN [id number].  Admission date:  05/30/2015  Admitting Physician  Albertine Grates, MD  Discharge Date:  06/04/2015   Primary MD  Karle Plumber, MD  Recommendations for primary care physician for things to follow:  -  Please follow on the results of skin biopsy -  Check labs including CBC, BMP during next visit    Admission Diagnosis  Thrombocytopenia [D69.6] Generalized abdominal pain [R10.84]   Discharge Diagnosis  Thrombocytopenia [D69.6] Generalized abdominal pain [R10.84]    Principal Problem:   Thrombocytopenia Active Problems:   Abdominal pain   Rash and nonspecific skin eruption   Hypertension   Small bowel obstruction, partial   Headache      Past Medical History  Diagnosis Date  . Hypertension   . Gout   . GERD (gastroesophageal reflux disease)     Past Surgical History  Procedure Laterality Date  . Cholecystectomy    . Appendectomy    . Hernia repair    . Foot surgery    . 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 He states that he was in his usual state of health until approximately 1 week ago when he started experiencing mild shocklike/pinprick like discomfort in both legs which was intermittent, lasting a couple seconds each time and recurring  every 15 minutes. Over the next day or so, this migrated upwards and eventually localized to his left occipital/periauricular area with moderate to severe pain in that area. This was associated with chills but no fevers. He went to a clinic where temperature was reported as 31 (states that this is fever for him because he usually runs temperature of 58F). They apparently did not find anything significant and patient was given Flexeril for muscle spasms. He took 4 tablets of the Flexeril without significant relief. He then started taking his disabled son's Tylenol No. 3 with good effect. He continued to take this until this morning but continued to have the left sided posterior scalp/headache. He presented to the ED where he was asked to take a deep breath at which time he first noticed some left abdominal discomfort which is present only on palpation or taking deep breaths. He has intermittent diarrhea but has been constipated since yesterday morning and last BM was yesterday morning. Flatus +. Reduced urine output and  urine appears dark. This morning while putting on his socks, he noted fine red rash on his legs. He has not noticed any tick bites. He does not live in a wooded area. 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 Apart from Flexeril and Tylenol No. 3, he has not taken any new medications. He states that in the past he has not been told to have any abnormal blood counts. 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 Patient was sent to [facility] as a direct admit to the hospitalist service.  Hospital Course  Thrombocytopenia with petechial rash -  Unclear etiology. No prior lab work to know chronicity of same. As per EDP note, patient had platelet count of 100, 4 days pta. - DD-infectious etiology (RMSF, Ehrlichiosis, Lyme's. Note mild leukopenia and mild transaminitis), primary blood disorder, autoimmune including vasculitis, medications versus other etiology. - Infectious disease consult greatly appreciated, Ehrlichia panel , RMSF , Lyme Abs, acute hepatitis panel and HIV are negative. - negative ANCA panel - Patient started on IV doxycycline by ID, as suspicious for tick borne disease (Embedded tick, tick bite mark and abdomen?),  Finished total of 14 days of  Doxycycline ( another 9 days left as an outpatient). - peripheral smear reviewed by pathologist showing activated lymphocytes and thrombocytopenia  - Elevated d-dimer (12.57)-probably an acute phase reactant in the absence of respiratory symptoms or chest pain. Fibrinogen 426. - Significantly elevated CRP  - skin  punch biopsy performed by surgery to evaluate rash for possible vasculitis .  - Thrombocytopenia improving, leukopenia resolved,  Abdominal pain - CT findings as above. No acute findings on abdominal exam. - Surgery input appreciated-no acute surgical needs. Recommend bowel regimen for constipation and outpatient follow-up regarding recurrent right inguinal hernia.  OSA - Continue nightly CPAP  GERD - Continue PPI  Hypertension - Patient adamantly denies and states that he only has white coat hypertension. For now monitor.  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  Headache/? Scalp pain - Unclear etiology. No focal deficits. Localized headache around left year/left occipital scalp. No acute findings on otoscopic exam bilaterally. - CT head: No acute findings - No meningismus. 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   Specialty:  Internal Medicine   Why:  appt at 3pm, bring insurance card, picture ID an copay   Contact information:   [address] [place] [address] [phone]         Discharge Instructions  and  Discharge Medications     Discharge Instructions    Diet - low sodium heart healthy    Complete by:  As  directed      Discharge instructions    Complete by:  As directed   Follow with Primary MD Karle Plumber, MD in 7 days   Get CBC, CMP, 2 view Chest X ray checked  by Primary MD next visit.    Activity: As tolerated with Full fall precautions use walker/cane & assistance as needed   Disposition Home **   Diet: Heart Healthy  , with feeding assistance and aspiration precautions.  For Heart failure patients - Check your Weight same time everyday, if you gain over 2 pounds, or you develop in leg swelling, experience more shortness of breath or chest pain, call your Primary MD immediately. Follow Cardiac Low Salt Diet and 1.5 lit/day fluid restriction.   On your next visit with your primary care physician please Get Medicines reviewed and adjusted.   Please request your Prim.MD to go over all Hospital Tests and Procedure/Radiological results at the follow up, please get all Hospital records sent to your Prim MD by signing hospital release before you go home.   If you experience worsening of your admission symptoms, develop shortness of breath, life threatening emergency, suicidal or homicidal thoughts you must seek medical attention  immediately by calling 911 or calling your MD immediately  if symptoms less severe.  You Must read complete instructions/literature along with all the possible adverse reactions/side effects for all the Medicines you take and that have been prescribed to you. Take any new Medicines after you have completely understood and accpet all the possible adverse reactions/side effects.   Do not drive, operating heavy machinery, perform activities at heights, swimming or participation in water activities or provide baby sitting services if your were admitted for syncope or siezures until you have seen by Primary MD or a Neurologist and advised to do so again.  Do not drive when taking Pain medications.    Do not take more than prescribed Pain, Sleep and Anxiety Medications  Special Instructions: If you have smoked or chewed Tobacco  in the last 2 yrs please stop smoking, stop any regular Alcohol  and or any Recreational drug use.  Wear Seat belts while driving.   Please note  You were cared for by a hospitalist during your hospital stay. If you have any questions about your discharge medications or the care you received while you were in the hospital after you are discharged, you can call the unit and asked to speak with the hospitalist on call if the hospitalist that took care of you is not available. Once you are discharged, your primary care physician will handle any further medical issues. Please note that NO REFILLS for any discharge medications will be authorized once you are discharged, as it is imperative that you return to your primary care physician (or establish a relationship with a primary care physician if you do not have one) for your aftercare needs so that they can reassess your need for medications and monitor your lab values.     Increase activity slowly    Complete by:  As directed             Medication List    STOP taking these medications        cyclobenzaprine 10 MG tablet   Commonly known as:  FLEXERIL      TAKE these medications        acetaminophen-codeine 300-30 MG per tablet  Commonly known as:  TYLENOL #3  Take 1-2 tablets by mouth every 4 (four)  hours as needed for moderate pain.     carbamazepine 200 MG 12 hr tablet  Commonly known as:  TEGRETOL XR  Take 1 tablet (200 mg total) by mouth 2 (two) times daily. Please take 1 tab oral twice  for 15 days , then 1 tablet oral daily for 10 days then stop.     doxycycline 100 MG tablet  Commonly known as:  VIBRA-TABS  Take 1 tablet (100 mg total) by mouth 2 (two) times daily. Please stop taking on 8/14     indomethacin 50 MG capsule  Commonly known as:  INDOCIN  Take 50 mg by mouth 2 (two) times daily with a meal.     omeprazole 20 MG capsule  Commonly known as:  PRILOSEC  Take 20 mg by mouth daily.     tamsulosin 0.4 MG Caps capsule  Commonly known as:  FLOMAX  Take 0.4 mg by mouth 2 (two) times daily.          Diet and Activity recommendation: See Discharge Instructions above   Consults obtained -  ID Neurology Gen Surgery   Major procedures and Radiology Reports - PLEASE review detailed and final reports for all details, in brief -      Ct Head Wo Contrast  05/31/2015   CLINICAL DATA:  Headaches for 5 days.  The personal history of CVA.  EXAM: CT HEAD WITHOUT CONTRAST  TECHNIQUE: Contiguous axial images were obtained from the base of the skull through the vertex without intravenous contrast.  COMPARISON:  Report of MRI brain 01/02/2013.  FINDINGS: Mild generalized atrophy and white matter disease is noted. This was previously reported. A remote lacunar infarct is again noted within the left thalamus. No acute cortical infarct, hemorrhage, or mass lesion is present. The basal ganglia are otherwise intact. The insular ribbon is intact.  Remote sinus surgery is noted. Mild mucosal thickening is present within residual left ethmoid air cells. The remaining paranasal sinuses and mastoid air  cells are clear. Atherosclerotic calcifications are present in the cavernous internal carotid arteries and at the dural margin of the vertebral arteries.  No significant extracranial soft tissue abnormality is present. The globes and orbits are intact.  IMPRESSION: 1. No acute intracranial abnormality or significant interval change. 2. Mild atrophy and white matter disease likely reflects the sequela of chronic microvascular ischemia. 3. Remote lacunar infarct of the left thalamus.   Electronically Signed   By: Marin Roberts M.D.   On: 05/31/2015 12:03   Mr Laqueta Jean VH Contrast  06/01/2015   CLINICAL DATA:  Bilateral upper extremity paresthesias and weakness. Left temporal headache. Duration of symptoms 1 week.  EXAM: MRI HEAD WITHOUT AND WITH CONTRAST  MRI CERVICAL SPINE WITHOUT AND WITH CONTRAST  TECHNIQUE: Multiplanar, multiecho pulse sequences of the brain and surrounding structures, and cervical spine, to include the craniocervical junction and cervicothoracic junction, were obtained without and with intravenous contrast.  CONTRAST:  20 cc MultiHance  COMPARISON:  Reports from examinations 2014  FINDINGS: MRI HEAD FINDINGS  Diffusion imaging does not show any acute or subacute infarction or other cause of restricted diffusion.  The brainstem and cerebellum are normal. The cerebral hemispheres show moderate chronic small-vessel ischemic changes affecting the white matter. No cortical or large vessel territory infarction. No mass lesion, hemorrhage, hydrocephalus or extra-axial collection. After contrast administration, no abnormal enhancement occurs. No pituitary mass. No inflammatory sinus disease. No skull or skullbase lesion.  MRI CERVICAL SPINE FINDINGS  No abnormal cervical cord  signal. The foramen magnum is widely patent. 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  C2-3:  Mild facet degeneration.  No canal or foraminal stenosis.  C3-4: Spondylosis with narrowing of the ventral  subarachnoid space but no cord compression. Bilateral foraminal narrowing because of osteophytic encroachment.  C4-5: Bulging of the disc. Narrowing of the ventral subarachnoid space but no compression of the cord. Mild foraminal narrowing because of osteophytic encroachment.  C5-6: Spondylosis with endplate osteophytes and bulging of the disc. Effacement of the ventral subarachnoid space but no compression of the cord. Foraminal stenosis bilaterally because of osteophytic encroachment.  C6-7: Spondylosis with endplate osteophytes and bulging of the disc. Narrowing of the ventral subarachnoid space but no cord compression. Foraminal encroachment bilaterally because of osteophytes.  C7-T1: Spondylosis with endplate osteophytes and bulging of the disc. Mild narrowing of the ventral subarachnoid space. Mild foraminal narrowing on the right and moderate foraminal narrowing on the left.  T1-2:  Normal.  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 No acute or reversible process.  MRI cervical spine: Degenerative spondylosis throughout the cervical region. No cord lesion. No compressive central canal stenosis. Foraminal narrowing at C3-4, C4-5, C5-6, C6-7 and C7-T1 that could possibly cause radicular symptoms on either or both sides.   Electronically Signed   By: Paulina Fusi M.D.   On: 06/01/2015 15:53   [name] Contrast  06/01/2015   CLINICAL DATA:  Bilateral upper extremity paresthesias and weakness. Left temporal headache. Duration of symptoms 1 week.  EXAM: MRI HEAD WITHOUT AND WITH CONTRAST  MRI CERVICAL SPINE WITHOUT AND WITH CONTRAST  TECHNIQUE: Multiplanar, multiecho pulse sequences of the brain and surrounding structures, and cervical spine, to include the craniocervical junction and cervicothoracic junction, were obtained without and with intravenous contrast.  CONTRAST:  20 cc MultiHance  COMPARISON:  Reports from examinations 2014   FINDINGS: MRI HEAD FINDINGS  Diffusion imaging does not show any acute or subacute infarction or other cause of restricted diffusion.  The brainstem and cerebellum are normal. The cerebral hemispheres show moderate chronic small-vessel ischemic changes affecting the white matter. No cortical or large vessel territory infarction. No mass lesion, hemorrhage, hydrocephalus or extra-axial collection. After contrast administration, no abnormal enhancement occurs. No pituitary mass. No inflammatory sinus disease. No skull or skullbase lesion.  MRI CERVICAL SPINE FINDINGS  No abnormal cervical cord signal. The foramen magnum is widely patent. 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  C2-3:  Mild facet degeneration.  No canal or foraminal stenosis.  C3-4: Spondylosis with narrowing of the ventral subarachnoid space but no cord compression. Bilateral foraminal narrowing because of osteophytic encroachment.  C4-5: Bulging of the disc. Narrowing of the ventral subarachnoid space but no compression of the cord. Mild foraminal narrowing because of osteophytic encroachment.  C5-6: Spondylosis with endplate osteophytes and bulging of the disc. Effacement of the ventral subarachnoid space but no compression of the cord. Foraminal stenosis bilaterally because of osteophytic encroachment.  C6-7: Spondylosis with endplate osteophytes and bulging of the disc. Narrowing of the ventral subarachnoid space but no cord compression. Foraminal encroachment bilaterally because of osteophytes.  C7-T1: Spondylosis with endplate osteophytes and bulging of the disc. Mild narrowing of the ventral subarachnoid space. Mild foraminal narrowing on the right and moderate foraminal narrowing on the left.  T1-2:  Normal.  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 No acute or reversible process.  MRI cervical spine:  Degenerative spondylosis throughout the cervical region. No  cord lesion. No compressive central canal stenosis. Foraminal narrowing at C3-4, C4-5, C5-6, C6-7 and C7-T1 that could possibly cause radicular symptoms on either or both sides.   Electronically Signed   By: Paulina Fusi M.D.   On: 06/01/2015 15:53   Ct Abdomen Pelvis W Contrast  05/30/2015   CLINICAL DATA:  Left lower quadrant pain, fever starting yesterday, status post appendectomy, cholecystectomy, hernia repair.  EXAM: CT ABDOMEN AND PELVIS WITH CONTRAST  TECHNIQUE: Multidetector CT imaging of the abdomen and pelvis was performed using the standard protocol following bolus administration of intravenous contrast.  CONTRAST:  25mL OMNIPAQUE IOHEXOL 300 MG/ML SOLN, OMNIPAQUE IOHEXOL 300 MG/ML SOLN  COMPARISON:  None.  FINDINGS: Lung bases are unremarkable. Atherosclerotic calcifications of abdominal aorta. Degenerative changes of the lumbar spine. There is dextroscoliosis of the lumbar spine.  Fatty infiltration of the liver is noted. Partially fatty replaced atrophic pancreas. The spleen and adrenal glands are unremarkable. Kidneys are symmetrical in size. There is no hydronephrosis or hydroureter. At least 2 nonobstructive calcified calculi are noted within left kidney the largest in lower pole measures 7.7 mm.  Delayed renal images shows bilateral renal symmetrical excretion.  No calcified ureteral calculi are noted. In axial image 72 there is a calcified calculus in right posterior bladder wall measures 4.8 mm. This is probable within bladder or just beyond the right UPJ. No dilatation of distal right ureter. Mild enlarged prostate gland with indentation of urinary bladder base. There is a distended urinary bladder.  Moderate stool noted in right colon and transverse colon. There is minimal distended terminal small bowel with fecal like material see axial image 58. In axial image 72 there is a small right inguinal scrotal canal hernia containing fat and small bowel loop measures 2.3 cm. There is no  definite evidence of small bowel obstruction at the level of hernia. However given the fecal like material within terminal small bowel partial small bowel obstruction or incompetent ileocecal valve cannot be excluded. No small bowel air-fluid levels are noted. There is no transition point in caliber of small bone. Seminal vesicles are unremarkable.  Sigmoid colon diverticula are noted. No evidence of acute colitis or diverticulitis  IMPRESSION: 1. Fatty infiltration of the liver.  Status postcholecystectomy. 2. Atrophic partially fatty replaced pancreas. 3. 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 No hydronephrosis or hydroureter. 4. There is probable calcified calculus in right posterior bladder wall probable just beyond the right UVJ measures 4.8 mm without evidence of ureteral obstruction. 5. There is a small right inguinal scrotal canal hernia containing fat and short segment of small bowel loop. Minimal distended terminal small bowel with fecal like material. Partial small bowel obstruction, ileus or incompetent ileal cecal valve cannot be excluded. Clinical correlation is necessary. No small bowel air-fluid levels. There is no evidence of transition point in caliber of small bowel. 6. Enlarged prostate gland with indentation of urinary bladder wall. Moderate distended urinary bladder.   Electronically Signed   By: Natasha Mead M.D.   On: 05/30/2015 12:24   Dg Abd 2 Views  05/31/2015   CLINICAL DATA:  Suspect partial small bowel obstruction due to abdominal distention and fever  EXAM: ABDOMEN - 2 VIEW  COMPARISON:  Abdominal pelvic CT scan of May 30, 2015  FINDINGS: The colonic stool burden is increased diffusely. There is contrast in the right colon from yesterday's CT scan. There are a few loops of mildly distended air-filled small bowel  in the mid abdomen. There are no free extraluminal gas collections. There are surgical clips in the gallbladder fossa and to the left of L4. There are degenerative  changes of the lumbar spine.  IMPRESSION: There is no high-grade bowel obstruction nor evidence of perforation. A few loops of air and fluid-filled small bowel are present in the mid abdomen. Increase colonic stool burden suggests clinical constipation.   Electronically Signed   By: David  [place] M.D.   On: 05/31/2015 07:33    Micro Results     Recent Results (from the past 240 hour(s))  Culture, blood (routine x 2)     Status: None (Preliminary result)   Collection Time: 06/01/15  3:45 PM  Result Value Ref Range Status   Specimen Description BLOOD RIGHT ARM  Final   Special Requests BOTTLES DRAWN AEROBIC AND ANAEROBIC 10CC EACH  Final   Culture   Final    NO GROWTH 3 DAYS Performed at [facility]    Report Status PENDING  Incomplete  Culture, blood (routine x 2)     Status: None (Preliminary result)   Collection Time: 06/01/15  3:55 PM  Result Value Ref Range Status   Specimen Description BLOOD RIGHT HAND  Final   Special Requests BOTTLES DRAWN AEROBIC AND ANAEROBIC 10CC EACH  Final   Culture   Final    NO GROWTH 3 DAYS Performed at [facility]    Report Status PENDING  Incomplete       Today   Subjective:   Leroy Morrison today has no headache,no chest abdominal pain,no new weakness tingling or numbness, feels much better wants to go home today.   Objective:   Blood pressure 127/95, pulse 78, temperature 98.3 F (36.8 C), temperature source Oral, resp. rate 16, height 6\' 1"  (1.854 m), weight 96.163 kg (212 lb), SpO2 97 %.   Intake/Output Summary (Last 24 hours) at 06/04/15 1400 Last data filed at 06/03/15 2058  Gross per 24 hour  Intake    300 ml  Output      0 ml  Net    300 ml    Exam Awake Alert, Oriented x 3, No new F.N deficits, Normal affect Hana.AT,PERRAL Supple Neck,No JVD, No cervical lymphadenopathy appriciated.  Symmetrical Chest wall movement, Good air movement bilaterally, CTAB RRR,No Gallops,Rubs or new Murmurs, No Parasternal  Heave +ve B.Sounds, Abd Soft, Non tender, No organomegaly appriciated, No rebound -guarding or rigidity. No Cyanosis, Clubbing or edema,   Data Review   CBC w Diff: Lab Results  Component Value Date   WBC 5.9 06/04/2015   HGB 12.3* 06/04/2015   HCT 35.9* 06/04/2015   PLT 154 06/04/2015   LYMPHOPCT 10* 06/03/2015   BANDSPCT 0 05/31/2015   MONOPCT 8 06/03/2015   EOSPCT 1 06/03/2015   BASOPCT 1 06/03/2015    CMP: Lab Results  Component Value Date   NA 130* 06/04/2015   K 3.8 06/04/2015   CL 96* 06/04/2015   CO2 24 06/04/2015   BUN 25* 06/04/2015   CREATININE 1.07 06/04/2015   PROT 6.4* 06/02/2015   ALBUMIN 3.0* 06/02/2015   BILITOT 1.6* 06/02/2015   ALKPHOS 98 06/02/2015   AST 37 06/02/2015   ALT 45 06/02/2015  .   Total Time in preparing paper work, data evaluation and todays exam - 35 minutes  ELGERGAWY, DAWOOD M.D on 06/04/2015 at 2:00 PM  [facility]   Office  [phone]

## 2015-06-04 NOTE — Care Management Note (Signed)
Case Management Note  Patient Details  Name: Leroy Morrison MRN: [id number] Date of Birth: 20-Feb-1941  Subjective/Objective:            Thrombocytopenia with petechial rash        Action/Plan: Home   Expected Discharge Date:  06/04/2015               Expected Discharge Plan:  Home w Home Health Services  In-House Referral:     Discharge planning Services  CM Consult  HH Arranged:  Patient Refused [facility] Agency:     Status of Service:  Completed, signed off  Medicare Important Message Given:  Yes-second notification given Date Medicare IM Given:    Medicare IM give by:    Date Additional Medicare IM Given:    Additional Medicare Important Message give by:     If discussed at Long Length of Stay Meetings, dates discussed:    Additional Comments: 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 Refusing HH PT at this time.  Elliot Cousin, RN 06/04/2015, 11:10 AM

## 2015-06-06 LAB — CULTURE, BLOOD (ROUTINE X 2)
Culture: NO GROWTH
Culture: NO GROWTH

## 2015-12-12 ENCOUNTER — Other Ambulatory Visit: Payer: Self-pay | Admitting: Specialist

## 2016-12-12 IMAGING — CT CT HEAD W/O CM
1 of 2 series · 15 of 30 positions shown, 19 images · non-contrast
Comparison: Report of MRI brain 01/02/2013.

CLINICAL DATA: Headaches for 5 days.  The personal history of CVA.

EXAM:
CT HEAD WITHOUT CONTRAST
TECHNIQUE: Contiguous axial images were obtained from the base of the skull
through the vertex without intravenous contrast.

[Series 2: headseq 4.8 h45s · axial · 0.43mm/px · z∈[-134,-9]mm · 15 of 30 slices shown, 19 images]
[im 2/30  brain]
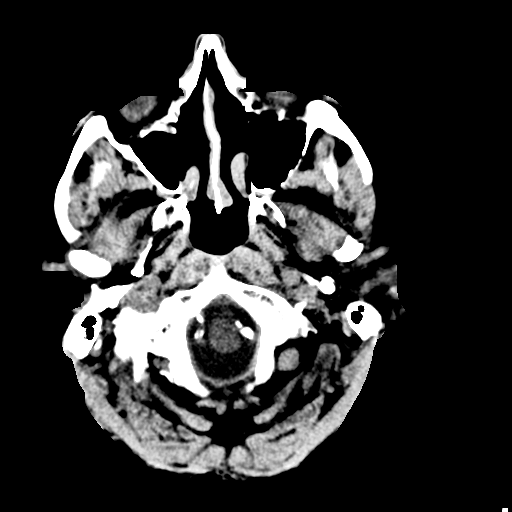
[im 2/30  bone]
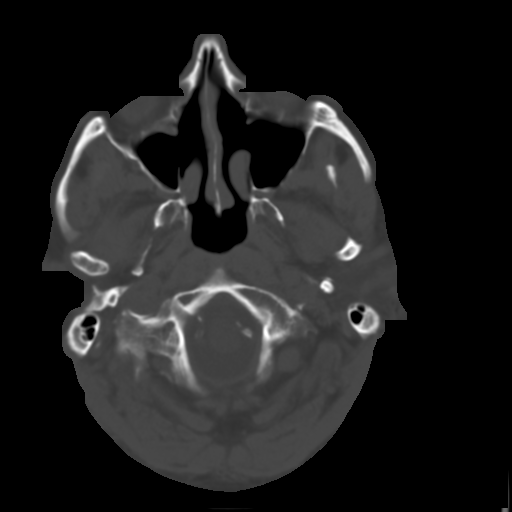
[im 4/30  brain]
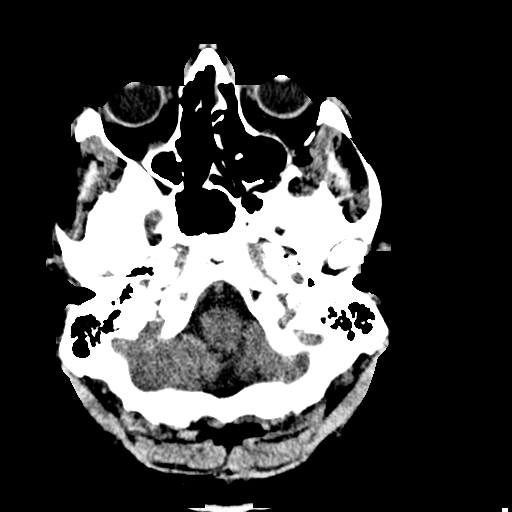
[im 5/30  brain]
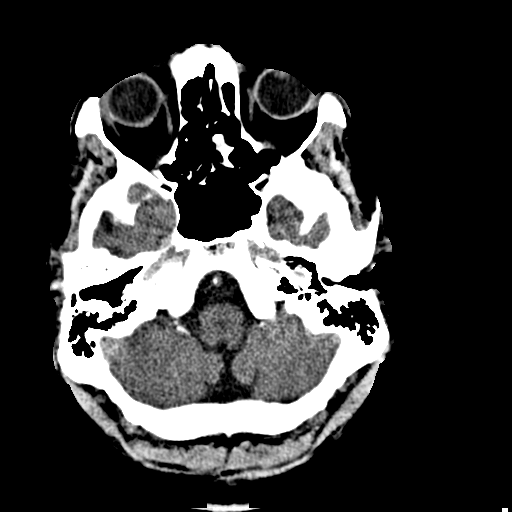
[im 8/30  brain]
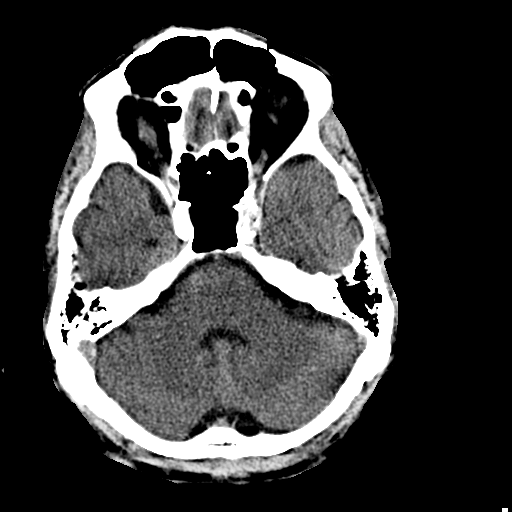
[im 9/30  brain]
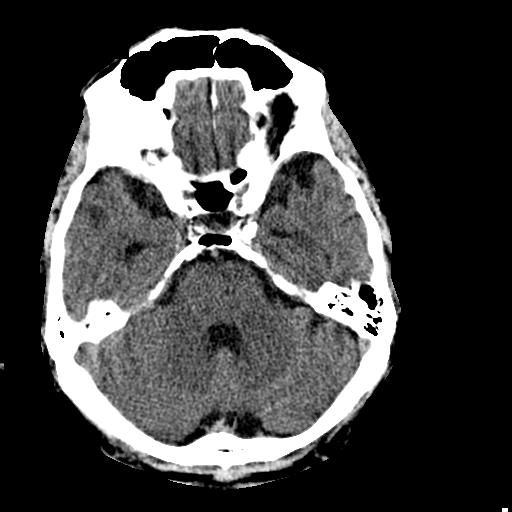
[im 9/30  bone]
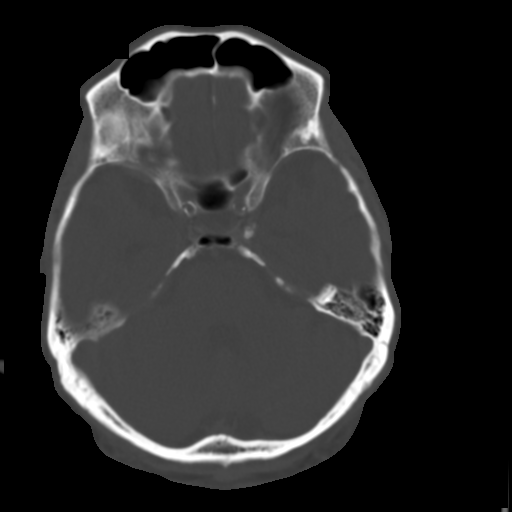
[im 11/30  brain]
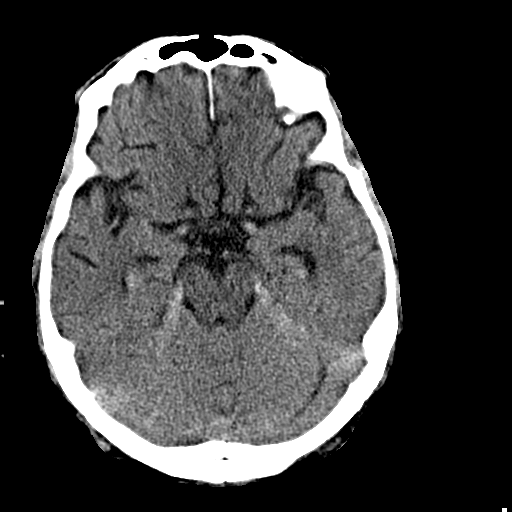
[im 13/30  brain]
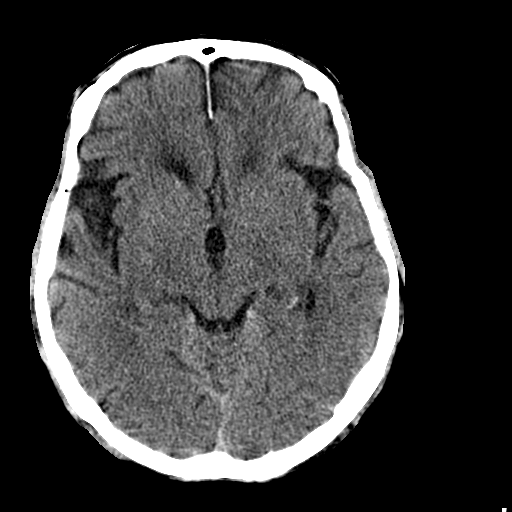
[im 15/30  brain]
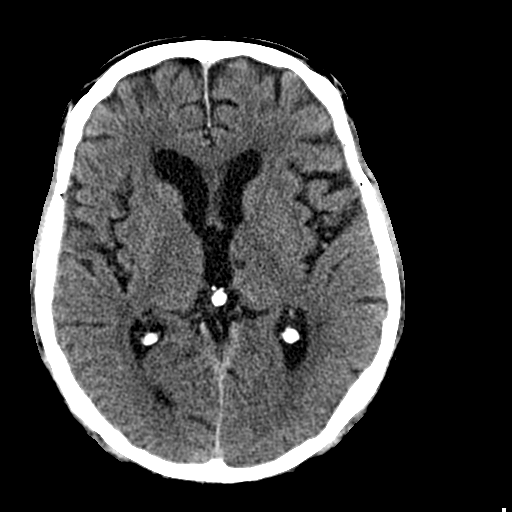
[im 17/30  brain]
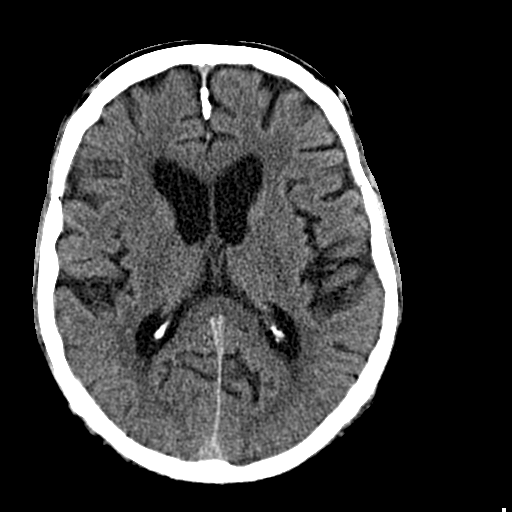
[im 17/30  bone]
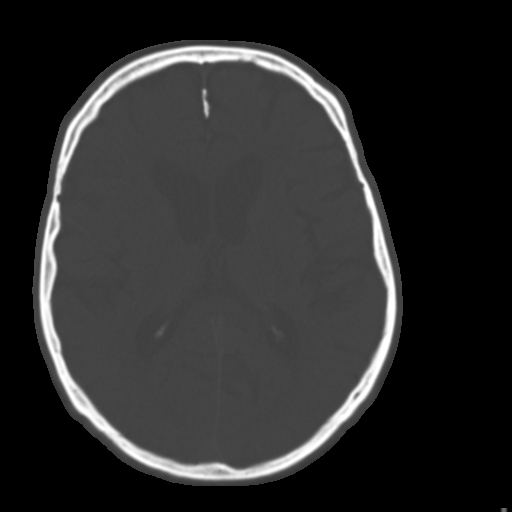
[im 19/30  brain]
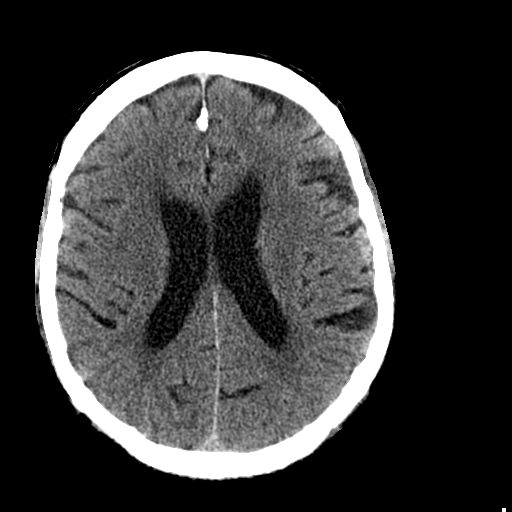
[im 21/30  brain]
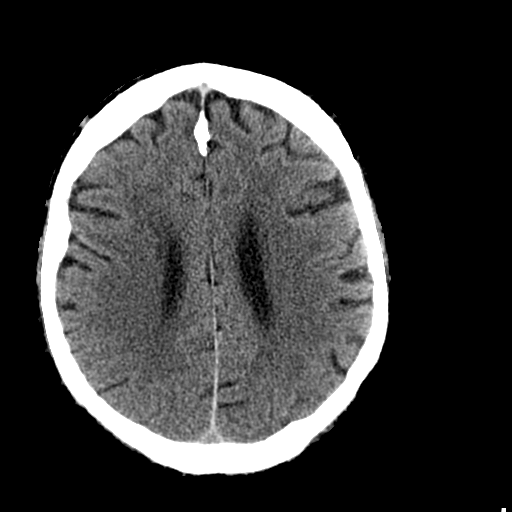
[im 22/30  brain]
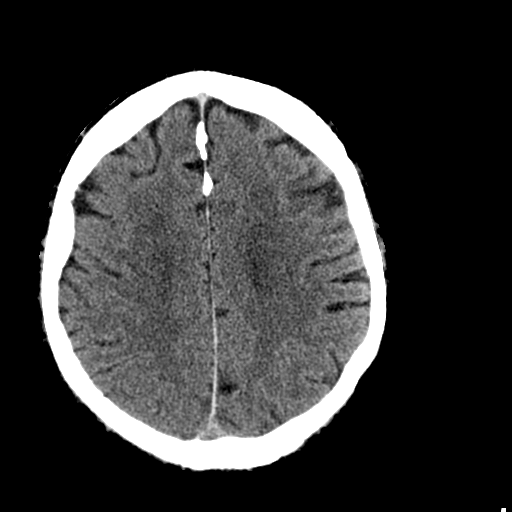
[im 25/30  brain]
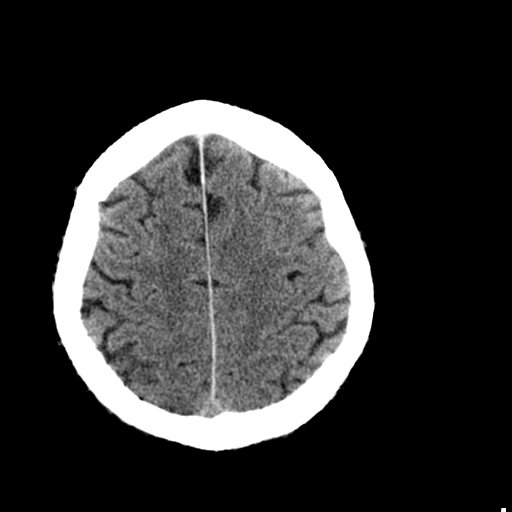
[im 25/30  bone]
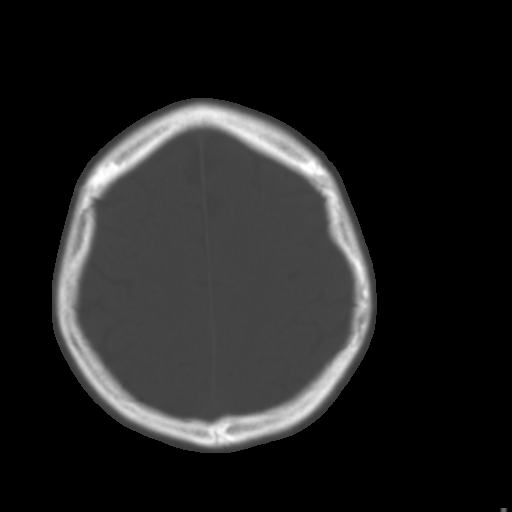
[im 26/30  brain]
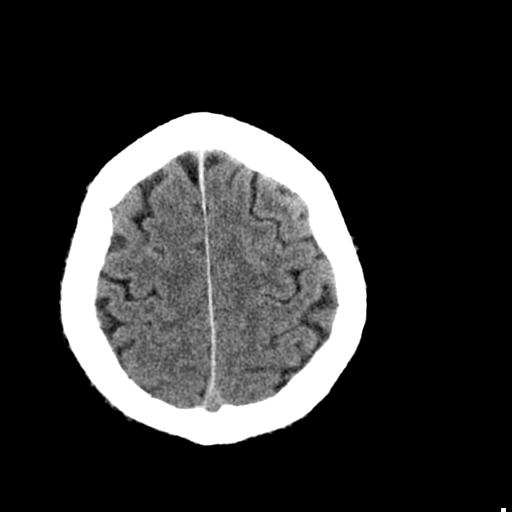
[im 28/30  brain]
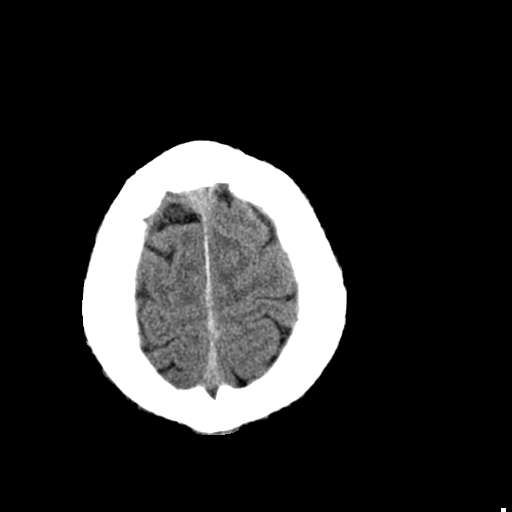

[15 of 30 positions shown; findings below may reference images not displayed]

FINDINGS: Mild generalized atrophy and white matter disease is noted. This was
previously reported. A remote lacunar infarct is again noted within
the left thalamus. No acute cortical infarct, hemorrhage, or mass
lesion is present. The basal ganglia are otherwise intact. The
insular ribbon is intact.

Remote sinus surgery is noted. Mild mucosal thickening is present
within residual left ethmoid air cells. The remaining paranasal
sinuses and mastoid air cells are clear. Atherosclerotic
calcifications are present in the cavernous internal carotid
arteries and at the dural margin of the vertebral arteries.

No significant extracranial soft tissue abnormality is present. The
globes and orbits are intact.
IMPRESSION: 1. No acute intracranial abnormality or significant interval change.
2. Mild atrophy and white matter disease likely reflects the sequela
of chronic microvascular ischemia.
3. Remote lacunar infarct of the left thalamus.

## 2023-05-31 NOTE — Consults (Signed)
 Cardiovascular Inpatient Consult  PCP: Fernando CHRISTELLA Honer, MD Primary Cardiologist: Vasu Consulting Cardiologist:Steven Lorrene Kitten    Assessment & Plan:   Constipation Recent right knee surgery CAD, S/P PCI 2017 Atrial Fibrillation, status post ablation, chronically on anticoagulation Dyslipidemia, chronic illness Hypertension, chronic illness Ischemic cardiomyopathy, mild reduction in LV function Recent syncope  I do not see any definite atrial fibrillation on his telemetry monitoring, but it is difficult because there is extensive artifact.  It appears to be mostly sinus rhythm with PACs perhaps a few short burst of atrial tach. His EF does not appear to be different than last reports. He is asymptomatic with regards to his cardiac status except for his recent questionable syncope.  A Holter monitor is in place.  That should be reviewed when it comes off. I think a low-dose beta-blocker would be reasonable therapy to add given his LV dysfunction and his atrial arrhythmias. We will arrange follow-up in our office.  Thank you for asking us  to consult on this patient Call if we can help further  HPI    Leroy Morrison is a 82 y.o. male with a past medical history significant for CAD, Atrial fibrillation who presented with a chief complaint of knee pain.We have been asked to see him at the kind request of the Hospitalist Service  for evaluation of  Heart problems  The patient states his heart does not bother him.  He is not having a problem with chest pain, shortness of breath, palpitations, swelling.  He did apparently have a syncopal spell a week or 2 ago and he has a Zio patch in place per the emergency room provider.  He has not seen a cardiologist in several years. He has a history of coronary disease and PCI done in 2017.  He also history of atrial fibrillation treated with pulmonary vein ablation and antiarrhythmic therapy.  He is chronically on  anticoagulation  Allergies  Allergen Reactions  . Penicillins Rash and Itching    Medications:  Prior to Admission medications   Medication Sig Start Date End Date Taking? Authorizing Provider  acetaminophen  (TYLENOL ) 325 mg tablet Take 325 mg by mouth every 6 (six) hours as needed. Patient taking differently: Take 650 mg by mouth every 6 (six) hours as needed. 12/06/21  Yes Charlie ORN Puschinsky, MD  acetaminophen  (TYLENOL ) 500 mg tablet Take 1,000 mg by mouth every 6 (six) hours as needed for mild pain (1-3).   Yes HISTORICAL PROVIDER, [facility]  allopurinoL (ZYLOPRIM) 300 mg tablet TAKE 1 TABLET BY MOUTH EVERY DAY 08/01/20  Yes Prashant Dattatraya Bhave, MD  apixaban (Eliquis) 5 mg tab Take 5 mg by mouth 2 (two) times a day. Patient taking differently: Take 5 mg by mouth daily. To hold 5 days prior to surgery 05/20/23 08/29/18  Yes Kristen Earnie Mouton, PA-C  atorvastatin (LIPITOR) 80 mg tablet Take 80 mg by mouth Once Daily. 05/25/16  Yes HISTORICAL PROVIDER, [facility]  docusate sodium  (COLACE) 100 mg capsule Take 1 capsule (100 mg total) by mouth 2 (two) times a day as needed for constipation for up to 10 days. 05/22/23 06/01/23 Yes Devon Fairy Olives, DPM  doxycycline  (VIBRA -TABS) 100 mg tablet Take 1 tablet (100 mg total) by mouth 2 (two) times a day for 5 days. Take with 8 oz water. Do not lie down for at least 30 minutes after. 05/22/23 06/27/23 Yes Devon Fairy Olives, DPM  famotidine (PEPCID) 20 mg tablet Take 20 mg by mouth 2 (  two) times a day. 02/26/19  Yes HISTORICAL PROVIDER, [facility]  levothyroxine (SYNTHROID) 25 mcg tablet Take 25 mcg by mouth Once Daily. 05/24/16  Yes HISTORICAL PROVIDER, [facility]  oxyCODONE -acetaminophen  (PERCOCET) 5-325 mg per tablet Take 1 tablet by mouth every 4 (four) hours as needed for moderate pain (4-6) for up to 24 doses. 05/22/23  Yes Devon Fairy Olives, DPM  tamsulosin  (FLOMAX ) 0.4 mg cap Take 0.8 mg by mouth daily.   Yes HISTORICAL  PROVIDER, [facility]    Past Medical History:  Diagnosis Date  . Anesthesia    elevated BP post op  . Anxiety   . Arthritis of knee   . BPH with obstruction/lower urinary tract symptoms   . Cancer (CMS/HCC)   . Cancer of neck (CMS/HCC)   . Carotid artery syndrome   . CHF (congestive heart failure) (CMS/HCC)   . Coronary artery disease   . GERD (gastroesophageal reflux disease)   . Gout   . H/O atrial flutter   . HTN (hypertension)    no medication  . Hyperlipidemia   . Idiopathic chronic gout of right foot without tophus   . NSTEMI (non-ST elevated myocardial infarction) (CMS/HCC)    2017  Stent placement; pt denies MI  . Orthostatic hypotension   . Renal calculi   . Right knee pain   . S/P ablation of atrial fibrillation    2019  . 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 Stented coronary artery   . Stroke (CMS/HCC)    mini strokes; pt denies  . Thyroid disease    Hypothyroid  . Urine frequency   . Wears glasses     Past Surgical History:  Procedure Laterality Date  . APPENDECTOMY     Procedure: APPENDECTOMY  . CARDIAC ELECTROPHYSIOLOGY MAPPING AND ABLATION  04/04/2018   Procedure: CARDIAC ELECTROPHYSIOLOGY MAPPING AND ABLATION; Dr Dannielle  . CARPAL TUNNEL RELEASE Bilateral    Procedure: CARPAL TUNNEL RELEASE; at [facility]  . CHOLECYSTECTOMY     Procedure: CHOLECYSTECTOMY  . COLONOSCOPY     Procedure: COLONOSCOPY  . COLONOSCOPY     Procedure: COLONOSCOPY  . CORONARY ANGIOPLASTY WITH STENT PLACEMENT  02/2016, 08/2016   Procedure: CORONARY ANGIOPLASTY WITH STENT PLACEMENT; 02/2016 X 1, 08/2016 X 3  . CYSTOSCOPY W/ STONE MANIPULATION N/A 01/27/2020   Procedure: CYSTOSCOPY W/ STONE MANIPULATION, HOLMIUM, BILATERAL STENTS;  Surgeon: Charlie Fallow Puschinsky, MD;  Location: [facility] MAIN OR;  Service: Urology;  Laterality: N/A;  . CYSTOSCOPY W/ STONE MANIPULATION Left 06/28/2021   Procedure: CYSTO,  STONE MANIPULATION, HOLMIUM LASER LEFT STENT BILATERAL RETROGRADES;  Surgeon: Charlie Fallow Puschinsky, MD;  Location: [facility] MAIN OR;  Service: Urology;  Laterality: Left;  . FOOT SURGERY Left    Procedure: FOOT SURGERY; 2/2 INTERNAL BONE INFECTION  . HERNIA REPAIR     Procedure: HERNIA REPAIR  . INGUINAL HERNIA REPAIR Right 04/03/2019   Procedure: INGUINAL HERNIA REPAIR OPEN, right with mesh;  Surgeon: Bonnie Gaither Cleotilde DOUGLAS, MD;  Location: [facility] MAIN OR;  Service: Trauma;  Laterality: Right;  . INGUINAL HERNIA REPAIR Right 09/14/2022   Procedure: INGUINAL HERNIA REPAIR ROBOTIC ASSISTED;  Surgeon: Teresa Prentice Blush, MD;  Location: [facility] MAIN OR;  Service: General;  Laterality: Right;  . KNEE ARTHROSCOPY Right    Procedure: KNEE ARTHROSCOPY  . LAPAROSCOPIC INGUINAL HERNIA REPAIR Right 08/29/2018   Procedure: INGUINAL HERNIA REPAIR LAPAROSCOPIC;  Surgeon: Gladis Alm Serge, MD;  Location: [facility]  MAIN OR;  Service: Emergency General Surgery;  Laterality: Right;  . LEG SURGERY Left    Procedure: LEG SURGERY; post farming accident ~ 71s  . NECK DISSECTION Right 05/26/2021   Procedure: NECK DISSECTION MODIFIED RADICAL W/ NERVE MONITOR;  Surgeon: Lamar Tamar Billet II, MD;  Location: [facility] MAIN OR;  Service: ENT;  Laterality: Right;  i need 2 hours.  I need the nerve monitor.  . OTHER SURGICAL HISTORY  10/2016   (STENTING OF LEFT ANTERIOR CEREBRAL ARTERY)[facility]  . SINUS SURGERY     Procedure: SINUS SURGERY  . TOTAL KNEE ARTHROPLASTY Right 05/20/2023   ARTHROPLASTY KNEE TOTAL REPLACEMENT performed by Ozell JINNY Rigg, MD at [facility] OR  . UROLIFT INSERTION N/A 12/06/2021   Procedure: UROLIFT INSERTION WITH CYSTOSCOPY;  Surgeon: Charlie Fallow Puschinsky, MD;  Location: [facility] MAIN OR;  Service: Urology;  Laterality: N/A;    Social History: Social History   Tobacco Use  Smoking Status Never  Smokeless Tobacco Never   Social History   Substance and Sexual Activity  Alcohol Use No   Social  History   Substance and Sexual Activity  Drug Use Never    Family History  Problem Relation Name Age of Onset  . Pancreatic cancer Mother    . Cancer Brother         Hodgkins Lymphoma  . Early death Brother    . Cancer Brother    . Anesthesia problems Neg Hx    . Clotting disorder Neg Hx      Code Status: DNAR / Limited SOTO  Review of Systems:  A complete review of systems was obtained. Pertinent items are noted in HPI.  Physical Examination:  VITAL SIGNS: BP 143/76   Pulse 82   Temp 97.6 F (36.4 C) (Oral)   Resp (!) 21   Ht 1.829 m (6')   Wt 95.6 kg (210 lb 12.2 oz)   SpO2 94%   BMI 28.58 kg/m   General:  Resting comfortably in NAD Skin:  Warm & dry Neuro:  Alert HEENT:  Sclera anicteric.   Resp:  Resp even and nonlabored.  Lungs sounds clear throughout CV:  S1S2 RRR with extrasystoles, no murmur noted, No gallops or rubs Abd:  Soft, nontender, NABS Ext:  No cyanosis or edema MSK:  Moving all extremities.   Neck:  No thyromegaly.  No JVD.  no carotid bruit(s) Pulses:  2+ and symmetrical upper and lower extremities.   Heme: No signs of bleeding or excessive bruising.  Objective Data Reviewed During this Patient Encounter:  Test Results EKG done yesterday shows sinus rhythm with PACs, nonspecific ST changes  Echocardiogram  done 05/30/2023:  There is severe concentric left ventricular hypertrophy.  Left ventricular systolic function is moderately reduced.  LV ejection fraction = 35-40%.  Moderate diastolic dysfunction [pseudonormal pattern] with elevated left atrial pressure.  There is akinesis of the LCx territory involving the basal and mid segments .  The right ventricle is mildly dilated.  The left atrium is mildly dilated.  The right atrium is mildly dilated.  There is trace mitral regurgitation.  Probably no significant change compared to prior study   Last Cath: 09/10/2016  Cath shows severe CAD. LAD has 50% mid stenosis. There is muscle bridge  with mild compression in the mid segment. Ramus branch has serial long diffuse 70% stenoses. LCX has 50% stenosis after OM1. OM1 has 50% proximal stenosis. RCA has a 99% thrombotic stenosis in the proximal segment. Mid to distal  RCA has long diffuse 70% stenosis. RCA lesions are stented with 2.25X38 mm Synergy, 2.5X7mm Premier, and 3.0X16 mm Premier DES. Proximal segment of the stent is post-dilated with a 3.5X7mm [place] at 18 ATM. There is 0% residual stenosis and TIMI III flow. There is no procedure complication. Loss of blood is <5 ml. No tissue sample is removed.   Labs:     Results from last 7 days  Lab Units 05/31/23 0340  WHITE BLOOD CELL COUNT 10*3/uL 6.32  RED BLOOD CELL COUNT 10*6/uL 3.88*  HEMOGLOBIN g/dL 87.1*  HEMATOCRIT % 62.8*  MEAN CORPUSCULAR VOLUME fL 95.6  MEAN CORPUSCULAR HEMOGLOBIN pg 33.0  MEAN CORPUSCULAR HEMOGLOBIN CONC g/dL 65.4  RED CELL DISTRIBUTION WIDTH % 14.1  PLATELET COUNT 10*3/uL 298  MEAN PLATELET VOLUME fL 7.1  . Results from last 7 days  Lab Units 05/31/23 0340 05/30/23 0332 05/29/23 0407  SODIUM mmol/L 134* 136 135*  POTASSIUM mmol/L 3.6 4.0 3.6  CHLORIDE mmol/L 102 104 104  BUN mg/dL 20 17 16   CREATININE mg/dL 8.97 9.00 9.14   No results found for: INR, BNP Lab Results  Component Value Date   CHOL 83 05/23/2023   TRIG 75 05/23/2023   HDL 46 (L) 05/23/2023   LDLCALC 21 05/23/2023   Portions of this note were dictated using DRAGON voice recognition software. Please disregard any errors in transcription.  This record has been created using Conservation officer, historic buildings. Errors have been sought and corrected, but may not always be located. Such creation errors do not reflect on the standard of medical care.

## 2023-11-14 LAB — LAB REPORT - SCANNED
EGFR (Non-African Amer.): 67
Free T4: 1.11 ng/dL
PSA, Total: 10.3
PTH: 84.4
TSH: 5.15 (ref 0.41–5.90)

## 2023-12-03 LAB — LAB REPORT - SCANNED
PSA, Total: 18.83
TSH: 3.96 (ref 0.41–5.90)

## 2023-12-23 LAB — LAB REPORT - SCANNED: EGFR (Non-African Amer.): 52

## 2024-02-19 LAB — LAB REPORT - SCANNED
A1c: 5.9
Alb/Creat Ratio, Ur: 43 mg/g{creat}
Albumin, Urine POC: 24.2
Creatinine, POC: 55.9 mg/dL
EGFR (Non-African Amer.): 67
Free T4: 0.97 ng/dL
PSA, Total: 17.91
PTH: 45.1
TSH: 3.33 (ref 0.41–5.90)

## 2024-03-18 ENCOUNTER — Encounter: Payer: Self-pay | Admitting: *Deleted

## 2024-03-18 ENCOUNTER — Other Ambulatory Visit: Payer: Self-pay | Admitting: *Deleted

## 2024-03-18 DIAGNOSIS — R55 Syncope and collapse: Secondary | ICD-10-CM

## 2024-03-18 DIAGNOSIS — I4891 Unspecified atrial fibrillation: Secondary | ICD-10-CM

## 2024-03-18 NOTE — Progress Notes (Signed)
Patient enrolled for [employer] to ship a 30 day cardiac event monitor to his address on file.  Letter with instructions mailed to patient.

## 2024-03-24 ENCOUNTER — Telehealth: Payer: Self-pay | Admitting: *Deleted

## 2024-03-24 NOTE — Telephone Encounter (Signed)
 Had discussion with Leroy Morrison regarding the letter sent to him regarding instructions for a [employer] cardiac event monitor.  He has not received the monitor from [employer] yet. Patient states he had a monitor already and did not understand why he was receiving another.  He said he had felt much better after having a monitor at [facility], but Dr. Lennie Ra was sending him to another facility.   We received paper work from [employer] which was forwarded to the monitor department.  Two tests were indicated on assessment and plan.  One was a 30 day cardiac event monitor and the seconds was CT angiogram with Calcium scoring. Patient is going to speak with [facility] center tomorrow.  I assured him that if a cardiac event monitor is not necessary.  I can cancel his enrollment.  If the monitor has already shipped, it can be returned in the prepaid return [employer] shipping envelope in the kit. He would not be charged for any monitor returned unused.   Patient will call me tomorrow to clarify how to proceed.

## 2024-03-24 NOTE — Telephone Encounter (Signed)
 Will forward this message to Coral [facility] for further management and follow-up with the pt.

## 2024-03-24 NOTE — Telephone Encounter (Signed)
 Patient is requesting to speak with Leroy Morrison in regard to the letter that was sent to him. Please advise.

## 2024-03-25 NOTE — Telephone Encounter (Signed)
 Pt returning call regarding monitor. Please advise

## 2024-03-26 NOTE — Telephone Encounter (Signed)
 Pt calling back to speak with you about monitor he just received.

## 2024-03-26 NOTE — Telephone Encounter (Signed)
 Pt requesting a c/b from [place] today. Please advise

## 2024-03-27 ENCOUNTER — Telehealth: Payer: Self-pay

## 2024-03-27 ENCOUNTER — Other Ambulatory Visit: Payer: Self-pay | Admitting: Family Medicine

## 2024-03-27 DIAGNOSIS — I2510 Atherosclerotic heart disease of native coronary artery without angina pectoris: Secondary | ICD-10-CM

## 2024-03-27 NOTE — Telephone Encounter (Signed)
 Received call from patient about his heart monitor and what he should do with it. Advised patient to not open the box. Will send to [facility] and [facility] for follow-up.   Willis Harter PA-C

## 2024-03-30 NOTE — Telephone Encounter (Signed)
 Patient recently wore a monitor at [facility]. He can follow the instructions and mail back the monitor to [employer] at no cost to him. He was thankful for the call and will drop it off at [employer] tomorrow. He was also confulsed about a referral to see one of our providers. But states he call his primary care office this am and they are working on it.

## 2024-03-30 NOTE — Telephone Encounter (Signed)
 Patient recently wore a monitor at [facility]. He can follow the instructions and mail back the monitor to [employer] at no cost to him. He was thankful for the call and will drop it off at [employer] tomorrow
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
## 2024-04-17 NOTE — Telephone Encounter (Signed)
 Spent a considerable length of time with patient.  He has a desire to have another 30 day cardiac event monitor.  Explained to patient we would need a physicians order to do that. He expressed he wanted to see a cardiologist at [facility].  Patient was told we would need a referral to set up an appointment.  He said he would contact Dr. Glady Laming to arrange a referral.

## 2024-04-17 NOTE — Telephone Encounter (Signed)
 Pt calling in asking to speak with [facility]

## 2024-04-20 ENCOUNTER — Telehealth: Payer: Self-pay | Admitting: *Deleted

## 2024-04-20 NOTE — Telephone Encounter (Signed)
 Explained to Leroy Morrison, I spoke with [name] at Dr. Dale office.  I told her I would need a prescription/ order to be faxed to our office at [phone] for a cardiac event monitor , dx R55, I48.91.  Once I have received the order, I will contact him to schedule an appointment for application and tutorial of the 30 day cardiac event monitor.

## 2024-04-20 NOTE — Telephone Encounter (Signed)
 New Message:     She wants to reschedule patient's Monitor please.

## 2024-04-20 NOTE — Telephone Encounter (Signed)
 Pt would like to speak to you with questions about heart monitor. Also will you tell him your last name because he thinks it is something else.

## 2024-04-23 ENCOUNTER — Telehealth: Payer: Self-pay | Admitting: *Deleted

## 2024-04-23 NOTE — Telephone Encounter (Signed)
 Leroy Morrison from Dr. Serena office had called and wanted to schedule an event monitor appointment for Leroy Morrison, ( Patient had called our office requesting another monitor.  He had been told we would need an order.  He stated he no longer want to see Dr. Licia), she was told we would need a prescription signed by the Dr. Dorena.  Instead of faxing us  a prescription, we recieved a fax from [facility] of a xerox copy of a procedure listing of an event monitor, dx. I48.91 , that was ordered by Leroy Morrison, on behalf of Leroy Licia, MD on 02/11/2024.  I believe this would have been the order for a [employer] monitor  Leroy Morrison said he already wore and returned.   I made 3 separate attempts to call [facility] and request a prescription/order, signed by the provider for an event monitor .  All three times, I was put on hold, for a total hold time of 50 minutes.  We will need a signed order before we can proceed with the monitor.

## 2024-04-27 NOTE — Telephone Encounter (Signed)
 Patient stated he is following-up as requested.

## 2024-04-29 ENCOUNTER — Encounter: Payer: Self-pay | Admitting: Cardiology

## 2024-04-29 DIAGNOSIS — I2510 Atherosclerotic heart disease of native coronary artery without angina pectoris: Secondary | ICD-10-CM | POA: Insufficient documentation

## 2024-04-29 DIAGNOSIS — R4182 Altered mental status, unspecified: Secondary | ICD-10-CM | POA: Insufficient documentation

## 2024-04-29 DIAGNOSIS — I4891 Unspecified atrial fibrillation: Secondary | ICD-10-CM | POA: Insufficient documentation

## 2024-04-29 NOTE — Progress Notes (Unsigned)
 Cardiology Office Note   Date:  04/30/2024   ID:  [facility]oy Morrison, DOB June 06, 1941, MRN [id number]  PCP:  Dorena Fernando HERO, MD  Cardiologist:   None Referring:  Dorena Fernando HERO, MD  No chief complaint on file.     History of Present Illness: [facility]oy Morrison is a 83 y.o. male who presents for evaluation of presyncope or syncope in June 2025.    The etiology was not clear.  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  He was being considered for loop.     He was seen at [facility].  He has had a history of atrial fib with ablation at [facility] in 2019.  He had recurrance and was on amiodarone.  .  He has had CAD with PCI in 2017.  Cardiac cath at that time demonstrated LAD 50% stenosis, ramus intermediate and serial long 70% stenosis, left circumflex had a 50% stenosis after OM1, RCA had 99% thrombotic stenosis treated with Synergy stent. (2.25X38 mm Synergy, 2.5X68mm Premier, and 3.0X16 mm Premier DES. Proximal segment of the stent is post-dilated with a 3.5X65mm [place] at 18 ATM. There is 0% residual stenosis and TIMI III flow.)  He had TIAs in the past.  Diving deep into the chart I also see that he had left internal carotid artery stenting in the past.   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  He started the conversation today with a very angry tone reporting that he has been to doctors for 8 years and they cannot help him.  His biggest complaint seems to be associated with fluctuating blood pressures.  He says his blood pressure systolic ranges from 180s to 90s.  He is diastolic is in the 50s at times.  When he does go below he feels very lightheaded.  He feels presyncopal.  He feels sluggish and does not respond appropriately.  He  has had a syncopal episode but not since last year.  He said he did wear a monitor recently for 4 weeks and during that time he felt fantastic.  He said as soon as the monitor was disconnected he started having his symptoms again and he is absolutely convinced that the monitor was therapeutic.  He was to get another monitor ordered by his primary provider and ordered through our clinic.  However, he was very angry about the fact that this was apparently a mistake.  He is here today demanding to have the monitor put on.  He is not describing chest pressure, neck or arm discomfort.  He is not having frequent palpitations and does not have any shortness of breath, PND or orthopnea.  Looking back in the chart I see that he has had some autonomic dysfunction.  I see that he has been intolerant of medications that have included carvedilol and   Past Medical History:  Diagnosis Date   Atrial fibrillation (HCC)    CAD (coronary artery disease)    GERD (gastroesophageal reflux disease)    Gout    Hypertension     Past Surgical History:  Procedure Laterality Date   APPENDECTOMY     CHOLECYSTECTOMY     FOOT SURGERY  HERNIA REPAIR     KNEE SURGERY       Current Outpatient Medications  Medication Sig Dispense Refill   acetaminophen  (TYLENOL ) 500 MG tablet Take 500 mg by mouth every 6 (six) hours as needed.     allopurinol (ZYLOPRIM) 300 MG tablet Take 300 mg by mouth daily.     ELIQUIS 2.5 MG TABS tablet Take 2.5 mg by mouth 2 (two) times daily.     famotidine (PEPCID) 20 MG tablet Take 20 mg by mouth 2 (two) times daily.     furosemide  (LASIX ) 20 MG tablet Take 1 tablet by mouth daily.     levothyroxine (SYNTHROID) 25 MCG tablet Take 37.5 mcg by mouth daily.     metoprolol succinate (TOPROL-XL) 50 MG 24 hr tablet Take 50 mg by mouth daily.     tamsulosin  (FLOMAX ) 0.4 MG CAPS capsule Take 0.4 mg by mouth 2 (two) times daily.  (Patient taking differently: Take 0.8 mg by mouth daily.)     No  current facility-administered medications for this visit.    Allergies:   Penicillins    Social History:  The patient  reports that he has never smoked. He does not have any smokeless tobacco history on file. He reports that he does not drink alcohol and does not use drugs.   Family History:  The patient's family history includes Pancreatic cancer in his mother.    ROS:  Please see the history of present illness.   Otherwise, review of systems are positive for none.   All other systems are reviewed and negative.    PHYSICAL EXAM: VS:  BP 94/62   Pulse 62   Ht 6' 1 (1.854 m)   Wt 211 lb (95.7 kg)   SpO2 96%   BMI 27.84 kg/m  , BMI Body mass index is 27.84 kg/m. GENERAL:  Well appearing HEENT:  Pupils equal round and reactive, fundi not visualized, oral mucosa unremarkable NECK:  No jugular venous distention, waveform within normal limits, carotid upstroke brisk and symmetric, no bruits, no thyromegaly LYMPHATICS:  No cervical, inguinal adenopathy LUNGS:  Clear to auscultation bilaterally BACK:  No CVA tenderness CHEST:  Unremarkable HEART:  PMI not displaced or sustained,S1 and S2 within normal limits, no S3, no S4, no clicks, no rubs, no murmurs ABD:  Flat, positive bowel sounds normal in frequency in pitch, no bruits, no rebound, no guarding, no midline pulsatile mass, no hepatomegaly, no splenomegaly EXT:  2 plus pulses throughout, no edema, no cyanosis no clubbing SKIN:  No rashes no nodules NEURO:  Cranial nerves II through XII grossly intact, motor grossly intact throughout [facility]:  Cognitively intact, oriented to person place and time   EKG:  EKG Interpretation Date/Time:  Thursday April 30 2024 10:45:17 EDT Ventricular Rate:  62 PR Interval:  190 QRS Duration:  110 QT Interval:  446 QTC Calculation: 452 R Axis:   56  Text Interpretation: Sinus rhythm with Premature atrial complexes RSR' or QR pattern in V1 suggests right ventricular conduction delay Nonspecific ST  and T wave abnormality When compared with ECG of 30-May-2015 14:34, Premature atrial complexes are now Present Confirmed by Lavona Agent ([id number]) on 04/30/2024 10:51:12 AM     Recent Labs: 02/19/2024: TSH 3.33    Lipid Panel No results found for: CHOL, TRIG, HDL, CHOLHDL, VLDL, LDLCALC, LDLDIRECT    Wt Readings from Last 3 Encounters:  04/30/24 211 lb (95.7 kg)  05/30/15 212 lb (96.2 kg)      Other studies  Reviewed: Additional studies/ records that were reviewed today include: Extensive review of outside records. Review of the above records demonstrates:  Please see elsewhere in the note.     ASSESSMENT AND PLAN: Syncope  : The patient has not had any further syncope but he certainly has presyncope probably related to hypotension.  Today in the office he was mildly orthostatic..  I do not think he is having any malignant arrhythmias but given the septal hypertrophy below and the symptoms I will apply a 4-week monitor.  Of note he is up-to-date with blood work including TSH.  I do see that he has a history of autonomic dysfunction and medication tolerance.  He has had a complicated vascular history.  CAD:  He does not have anginal symptoms.  No change in therapy.  Atrial fib: He has had no symptomatic paroxysms.  He tolerates anticoagulation.  Sleep apnea: He says he no longer has sleep apnea.    HTN: As above he is had intolerances to medications.  He has been sensitive to various agents and is listed as having autonomic dysfunction.  This makes med titration difficult.  I would think it reasonable if he would ever consent to a 24-hour blood pressure monitor if he continues to have symptoms.  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  He had mildly thickened heart valve.  At this point he does not look like he has any high risk features or symptoms necessarily consistent with dynamic obstruction.    Current medicines are reviewed at length with the patient  today.  The patient does not have concerns regarding medicines.  The following changes have been made:  no change  Labs/ tests ordered today include:   Orders Placed This Encounter  Procedures   CARDIAC EVENT MONITOR   EKG 12-Lead     Disposition:   FU with APP in six months.     Signed, Lynwood Schilling, MD  04/30/2024 1:22 PM    [facility]

## 2024-04-29 NOTE — Telephone Encounter (Signed)
 Patient called to follow-up on letter sent regarding monitor.

## 2024-04-29 NOTE — Telephone Encounter (Signed)
 Made contact with patient and clarified with him that Rico Dixons can not order another monitor with him.  Provided recommendation that he partner with his provider to get a referral to establish with [facility] or call [phone] and request to be a self referral.    Lonni Glendia Richards RN, BSN, CCRN

## 2024-04-30 ENCOUNTER — Encounter: Payer: Self-pay | Admitting: Cardiology

## 2024-04-30 ENCOUNTER — Ambulatory Visit: Attending: Cardiology | Admitting: Cardiology

## 2024-04-30 VITALS — BP 94/62 | HR 62 | Ht 73.0 in | Wt 211.0 lb

## 2024-04-30 DIAGNOSIS — I2510 Atherosclerotic heart disease of native coronary artery without angina pectoris: Secondary | ICD-10-CM

## 2024-04-30 DIAGNOSIS — R4182 Altered mental status, unspecified: Secondary | ICD-10-CM

## 2024-04-30 DIAGNOSIS — R55 Syncope and collapse: Secondary | ICD-10-CM

## 2024-04-30 DIAGNOSIS — I4891 Unspecified atrial fibrillation: Secondary | ICD-10-CM

## 2024-04-30 NOTE — Patient Instructions (Signed)
 Medication Instructions:  Your physician recommends that you continue on your current medications as directed. Please refer to the Current Medication list given to you today.  *If you need a refill on your cardiac medications before your next appointment, please call your pharmacy*  Lab Work: NONE If you have labs (blood work) drawn today and your tests are completely normal, you will receive your results only by: MyChart Message (if you have MyChart) OR A paper copy in the mail If you have any lab test that is abnormal or we need to change your treatment, we will call you to review the results.  Testing/Procedures: 4 Week Event Monitor Your physician has recommended that you wear an event monitor. Event monitors are medical devices that record the heart's electrical activity. Doctors most often us  these monitors to diagnose arrhythmias. Arrhythmias are problems with the speed or rhythm of the heartbeat. The monitor is a small, portable device. You can wear one while you do your normal daily activities. This is usually used to diagnose what is causing palpitations/syncope (passing out).   Follow-Up: At [facility], you and your health needs are our priority.  As part of our continuing mission to provide you with exceptional heart care, our providers are all part of one team.  This team includes your primary Cardiologist (physician) and Advanced Practice Providers or APPs (Physician Assistants and Nurse Practitioners) who all work together to provide you with the care you need, when you need it.  Your next appointment:   6 month(s)  Provider:   APP  We recommend signing up for the patient portal called MyChart.  Sign up information is provided on this After Visit Summary.  MyChart is used to connect with patients for Virtual Visits (Telemedicine).  Patients are able to view lab/test results, encounter notes, upcoming appointments, etc.  Non-urgent messages can be sent to your  provider as well.   To learn more about what you can do with MyChart, go to [email or web address].

## 2024-06-08 ENCOUNTER — Ambulatory Visit: Attending: Cardiology

## 2024-06-08 DIAGNOSIS — R55 Syncope and collapse: Secondary | ICD-10-CM | POA: Diagnosis not present

## 2024-06-11 ENCOUNTER — Ambulatory Visit: Payer: Self-pay | Admitting: Cardiology

## 2024-06-25 NOTE — Progress Notes (Unsigned)
 Cardiology Office Note:   Date:  06/26/2024  ID:  Leroy Morrison, DOB 07/14/1941, MRN [id number] PCP: Leroy Fernando HERO, MD  [facility] Providers Cardiologist:  None {  History of Present Illness:   Leroy Morrison is a 83 y.o. male  who presents for evaluation of presyncope or syncope in June 2025.    The etiology was not clear.  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  He was being considered for loop.      He was seen at [facility].  He has had a history of atrial fib with ablation at [facility] in 2019.  He had recurrance and was on amiodarone.  .  He has had CAD with PCI in 2017.  Cardiac cath at that time demonstrated LAD 50% stenosis, ramus intermediate and serial long 70% stenosis, left circumflex had a 50% stenosis after OM1, RCA had 99% thrombotic stenosis treated with Synergy stent. (2.25X38 mm Synergy, 2.5X29mm Premier, and 3.0X16 mm Premier DES. Proximal segment of the stent is post-dilated with a 3.5X78mm [place] at 18 ATM. There is 0% residual stenosis and TIMI III flow.)  He had TIAs in the past.  Diving deep into the chart I also see that he had left internal carotid artery stenting in the past.   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   At the last visit he demanded to have a monitor put on because he said when he wears the monitor he feels better.  The monitor demonstrated short runs of nonsustained ventricular tachycardia which were not causing symptoms.  Episodes of dizziness, feeling tired, lightheaded, chest pain, rapid heartbeat, shortness of breath, flutter, skipped beats and syncope all occurred with normal sinus rhythm but without any sustained arrhythmias that would explain the events.    It somewhat  difficult to follow his symptoms.  He does bring me a blood pressure diary which is nicely complete.  Is a little bit labile but typically elevated.  He says he does not really feel well when it is below 160 systolic but I see a lot in the 130s 140s and 150s systolic.  Heart rates have been in the 60s fairly consistently.  There have been some in the 50s.  He says that he has had some good days.  He still does get some lightheaded episodes.  He has a pulse oximeter now and he recorded his heart rate which does not seem to be particularly low.  He seems to be saying his oxygen saturation can be low but I do not have demonstration of this and has not been hypoxemic in the past.  He is not describing chest pain.  He is not describing PND or orthopnea.   ROS: As stated in the HPI and negative for all other systems.  Studies Reviewed:    EKG:     NA  Risk Assessment/Calculations:          Physical Exam:   VS:  BP (!) 161/93   Pulse 73   Ht 6' 1 (1.854 m)   Wt 205 lb (93 kg)   SpO2 98%   BMI 27.05 kg/m    Wt Readings from Last 3 Encounters:  06/26/24  205 lb (93 kg)  04/30/24 211 lb (95.7 kg)  05/30/15 212 lb (96.2 kg)     GEN: Well nourished, well developed in no acute distress NECK: No JVD; No carotid bruits CARDIAC: RRR, no murmurs, rubs, gallops RESPIRATORY:  Clear to auscultation without rales, wheezing or rhonchi  ABDOMEN: Soft, non-tender, non-distended EXTREMITIES:  No edema; No deformity   ASSESSMENT AND PLAN:   Syncope  :   The etiology of his syncope is not clear.  I do see that he has had some autonomic dysfunction and med intolerance.  He has no rhythm issue that would require a pacemaker.  He was mildly orthostatic in the office before.  No change in therapy. He has no anginal symptoms.  No change in therapy.   CAD:    Atrial fib:   He has had no symptomatic paroxysms.  He tolerates anticoagulation.   Sleep apnea: He says he no longer has sleep apnea.  He does not use  CPAP.  His wife does not sleep in the same room with him.  I do not think he be interested in CPAP.    HTN:   He has had multiple med intolerances but I will be changing his meds as below.    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  No change in therapy. Ectopy: He has lots of iolated ventricular ectopy and supraventricular ectopy.  This likely symptomatic.  I would like to try a very low-dose of Cardizem  30 mg once daily and see if this does not improve some of his symptoms.   Follow up APP in six months.   Signed, Lynwood Schilling, MD

## 2024-06-26 ENCOUNTER — Telehealth: Payer: Self-pay | Admitting: Cardiology

## 2024-06-26 ENCOUNTER — Ambulatory Visit: Attending: Cardiology | Admitting: Cardiology

## 2024-06-26 ENCOUNTER — Encounter: Payer: Self-pay | Admitting: Cardiology

## 2024-06-26 VITALS — BP 161/93 | HR 73 | Ht 73.0 in | Wt 205.0 lb

## 2024-06-26 DIAGNOSIS — I2510 Atherosclerotic heart disease of native coronary artery without angina pectoris: Secondary | ICD-10-CM | POA: Diagnosis not present

## 2024-06-26 DIAGNOSIS — R55 Syncope and collapse: Secondary | ICD-10-CM

## 2024-06-26 MED ORDER — DILTIAZEM HCL 30 MG PO TABS: 30.0000 mg | ORAL_TABLET | Freq: Every day | ORAL | 3 refills | Status: AC

## 2024-06-26 NOTE — Patient Instructions (Signed)
 Medication Instructions:  Start Cardizem  30 mg once daily *If you need a refill on your cardiac medications before your next appointment, please call your pharmacy*  Lab Work: NONE If you have labs (blood work) drawn today and your tests are completely normal, you will receive your results only by: MyChart Message (if you have MyChart) OR A paper copy in the mail If you have any lab test that is abnormal or we need to change your treatment, we will call you to review the results.  Testing/Procedures: NONE  Follow-Up: At [facility], you and your health needs are our priority.  As part of our continuing mission to provide you with exceptional heart care, our providers are all part of one team.  This team includes your primary Cardiologist (physician) and Advanced Practice Providers or APPs (Physician Assistants and Nurse Practitioners) who all work together to provide you with the care you need, when you need it.  Your next appointment:   3 months   Provider:   APP  We recommend signing up for the patient portal called MyChart.  Sign up information is provided on this After Visit Summary.  MyChart is used to connect with patients for Virtual Visits (Telemedicine).  Patients are able to view lab/test results, encounter notes, upcoming appointments, etc.  Non-urgent messages can be sent to your provider as well.   To learn more about what you can do with MyChart, go to [email or web address].

## 2024-06-26 NOTE — Telephone Encounter (Signed)
 Pt just saw Dr. Lavona this morning and walked out into the lobby to leave his appointment when he states he felt woozy. Pt looked at his pulse oximeter and his oxygen was reading above 90s%, then showed 65%, and then went back to 97%. Explained to pt that the reading can be altered by position on the finger as well as temperature (if fingers are too cold they can have an altered reading d/t circulation). Patient stated he did not agree with that explanation and then proceeded to explain that Dr. Lavona feels his heart is skipping beats leading to the symptoms he was having and the pt stated that he (the pt) was worried about his oxygen saying 65%. Educated pt that if the oxygen pleth is not a steady line (explained that if the line is jumping all around, this would not be considered a steady reading), it is not an accurate reading and this is why the reading can jump quickly from one number to another that quick. Explained that in the short time the patient described having the episode in the lobby, it is not likely that his oxygen would drop from the 90s, to 65, and then jump back up to the 90s again. Patient stated I guess I will just go home and try the pills then. Pt advised to call our office with any questions or concerns he may have.

## 2024-06-26 NOTE — Telephone Encounter (Signed)
 Reason for walk-in: Walk-in Reasons: appointment request (pt just seen w/Dr. Lavona - pt states his oxygen/HR is reading 23 & should be 90+).   If patient is requesting to be seen today, or if patient is having symptoms:  What symptoms are being reported (if any)?    Route to triage pool and ensure Teams message has been sent to the Triage Walk-In chat.  3.   For medication samples, medication refills, HIM requests, appointment requests, lab-related requests, or form/record drop-off, please route to the appropriate pool.

## 2024-07-25 ENCOUNTER — Emergency Department (HOSPITAL_COMMUNITY)

## 2024-07-25 ENCOUNTER — Other Ambulatory Visit: Payer: Self-pay

## 2024-07-25 ENCOUNTER — Encounter (HOSPITAL_COMMUNITY): Payer: Self-pay

## 2024-07-25 ENCOUNTER — Emergency Department (HOSPITAL_COMMUNITY)
Admission: EM | Admit: 2024-07-25 | Discharge: 2024-07-25 | Disposition: A | Discharge status: Home / Self Care | Attending: Emergency Medicine | Admitting: Emergency Medicine

## 2024-07-25 DIAGNOSIS — I2510 Atherosclerotic heart disease of native coronary artery without angina pectoris: Secondary | ICD-10-CM | POA: Diagnosis not present

## 2024-07-25 DIAGNOSIS — R42 Dizziness and giddiness: Secondary | ICD-10-CM | POA: Insufficient documentation

## 2024-07-25 DIAGNOSIS — I482 Chronic atrial fibrillation, unspecified: Secondary | ICD-10-CM | POA: Insufficient documentation

## 2024-07-25 DIAGNOSIS — R8281 Pyuria: Secondary | ICD-10-CM | POA: Diagnosis not present

## 2024-07-25 DIAGNOSIS — Z7901 Long term (current) use of anticoagulants: Secondary | ICD-10-CM | POA: Insufficient documentation

## 2024-07-25 LAB — COMPREHENSIVE METABOLIC PANEL WITH GFR
ALT: 13 U/L (ref 0–44)
AST: 31 U/L (ref 15–41)
Albumin: 3.8 g/dL (ref 3.5–5.0)
Alkaline Phosphatase: 65 U/L (ref 38–126)
Anion gap: 13 (ref 5–15)
BUN: 23 mg/dL (ref 8–23)
CO2: 23 mmol/L (ref 22–32)
Calcium: 8.9 mg/dL (ref 8.9–10.3)
Chloride: 102 mmol/L (ref 98–111)
Creatinine, Ser: 1.08 mg/dL (ref 0.61–1.24)
GFR, Estimated: >60 mL/min (ref >60)
Glucose, Bld: 101 mg/dL — ABNORMAL HIGH (ref 70–99)
Potassium: 3.7 mmol/L (ref 3.5–5.1)
Sodium: 138 mmol/L (ref 135–145)
Total Bilirubin: 1.2 mg/dL (ref 0.0–1.2)
Total Protein: 6.7 g/dL (ref 6.5–8.1)

## 2024-07-25 LAB — CBC
HCT: 39.1 % (ref 39.0–52.0)
Hemoglobin: 13.2 g/dL (ref 13.0–17.0)
MCH: 33 pg (ref 26.0–34.0)
MCHC: 33.8 g/dL (ref 30.0–36.0)
MCV: 97.8 fL (ref 80.0–100.0)
Platelets: 135 K/uL — ABNORMAL LOW (ref 150–400)
RBC: 4 MIL/uL — ABNORMAL LOW (ref 4.22–5.81)
RDW: 13.6 % (ref 11.5–15.5)
WBC: 7.5 K/uL (ref 4.0–10.5)
nRBC: 0 % (ref 0.0–0.2)

## 2024-07-25 LAB — URINALYSIS, W/ REFLEX TO CULTURE (INFECTION SUSPECTED)
Bilirubin Urine: NEGATIVE
Glucose, UA: NEGATIVE mg/dL
Hgb urine dipstick: NEGATIVE
Ketones, ur: NEGATIVE mg/dL
Nitrite: NEGATIVE
Protein, ur: NEGATIVE mg/dL
Specific Gravity, Urine: 1.011 (ref 1.005–1.030)
WBC, UA: >50 WBC/hpf (ref 0–5)
pH: 6 (ref 5.0–8.0)

## 2024-07-25 LAB — LIPASE, BLOOD: Lipase: 16 U/L (ref 11–51)

## 2024-07-25 MED ORDER — MECLIZINE HCL 25 MG PO TABS
25.0000 mg | ORAL_TABLET | Freq: Once | ORAL | Status: AC
  Administered 2024-07-25: 25 mg via ORAL
  Filled 2024-07-25: qty 1

## 2024-07-25 MED ORDER — CEPHALEXIN 500 MG PO CAPS: 500.0000 mg | ORAL_CAPSULE | Freq: Four times a day (QID) | ORAL | 0 refills | Status: AC

## 2024-07-25 MED ORDER — CEPHALEXIN 250 MG PO CAPS
500.0000 mg | ORAL_CAPSULE | Freq: Once | ORAL | Status: AC
  Administered 2024-07-25: 500 mg via ORAL
  Filled 2024-07-25: qty 2

## 2024-07-25 MED ORDER — MECLIZINE HCL 12.5 MG PO TABS: 12.5000 mg | ORAL_TABLET | Freq: Three times a day (TID) | ORAL | 0 refills | Status: AC | PRN

## 2024-07-25 MED ORDER — DILTIAZEM HCL 30 MG PO TABS
30.0000 mg | ORAL_TABLET | Freq: Every day | ORAL | Status: DC
Start: 2024-07-25 — End: 2024-07-25
  Administered 2024-07-25: 30 mg via ORAL
  Filled 2024-07-25 (×2): qty 1

## 2024-07-25 MED ORDER — SODIUM CHLORIDE 0.9 % IV BOLUS
500.0000 mL | Freq: Once | INTRAVENOUS | Status: AC
  Administered 2024-07-25: 500 mL via INTRAVENOUS

## 2024-07-25 MED ORDER — APIXABAN 2.5 MG PO TABS
2.5000 mg | ORAL_TABLET | Freq: Two times a day (BID) | ORAL | Status: DC
  Administered 2024-07-25: 2.5 mg via ORAL
  Filled 2024-07-25: qty 1

## 2024-07-25 NOTE — ED Notes (Signed)
 Patient transported to MRI

## 2024-07-25 NOTE — ED Triage Notes (Signed)
 Pt c.o dizziness, n/v since 430am.  BP 190/110 HR 78  CBG 163 Pt given 2.5mg  droperidol IV PTA

## 2024-07-25 NOTE — ED Notes (Signed)
 IT contacted to request open ticket for the sun quest machine.  It is not recognizing the USB port.

## 2024-07-25 NOTE — ED Notes (Signed)
 Please call spouse with any updates [facility] [phone]

## 2024-07-25 NOTE — ED Provider Notes (Cosign Needed Addendum)
 [facility] Provider Note   CSN: [id number] Arrival date & time: 07/25/24  0700     Patient presents with: Dizziness and Emesis  HPI Leroy Morrison is a 83 y.o. male with history of small bowel obstruction, CAD, A-fib on Eliquis presenting for dizziness and nausea.  Started at 4:30 AM this morning.  He characterizes the dizziness as a lightheadedness and wooziness.  Improved with sitting back down he noted at that time his blood pressure was 220/120.  Denies any visual disturbance.  States he had a normal bowel movement last night and denies abdominal pain.  Reports now that the dizziness has resolved after he was given droperidol by EMS PTA.  Denies chest pain, shortness of breath and palpitations.    Dizziness Associated symptoms: vomiting   Emesis      Prior to Admission medications   Medication Sig Start Date End Date Taking? Authorizing Provider  cephALEXin (KEFLEX) 500 MG capsule Take 1 capsule (500 mg total) by mouth 4 (four) times daily for 5 days. 07/25/24 07/30/24 Yes Forney Kleinpeter K, PA-C  meclizine (ANTIVERT) 12.5 MG tablet Take 1 tablet (12.5 mg total) by mouth 3 (three) times daily as needed for up to 15 doses for dizziness. 07/25/24  Yes Eldred Sooy K, PA-C  acetaminophen  (TYLENOL ) 500 MG tablet Take 500 mg by mouth every 6 (six) hours as needed.    [provider]  allopurinol (ZYLOPRIM) 300 MG tablet Take 300 mg by mouth daily.    [provider]  diltiazem  (CARDIZEM ) 30 MG tablet Take 1 tablet (30 mg total) by mouth daily. 06/26/24   Lavona Agent, MD  ELIQUIS 2.5 MG TABS tablet Take 2.5 mg by mouth 2 (two) times daily.    [provider]  famotidine (PEPCID) 20 MG tablet Take 20 mg by mouth 2 (two) times daily. 02/26/19   [provider]  furosemide  (LASIX ) 20 MG tablet Take 1 tablet by mouth daily. 08/21/23   [provider]  levothyroxine (SYNTHROID) 25 MCG tablet Take  37.5 mcg by mouth daily.    [provider]  metoprolol succinate (TOPROL-XL) 50 MG 24 hr tablet Take 50 mg by mouth daily.    [provider]  tamsulosin  (FLOMAX ) 0.4 MG CAPS capsule Take 0.4 mg by mouth 2 (two) times daily.  Patient taking differently: Take 0.8 mg by mouth daily.    [provider]    Allergies: Penicillins    Review of Systems  Gastrointestinal:  Positive for vomiting.  Neurological:  Positive for dizziness.    Updated Vital Signs BP (!) 182/102   Pulse 69   Temp (!) 97.5 F (36.4 C)   Resp 15   Ht 6' (1.829 m)   Wt 96.2 kg   SpO2 100%   BMI 28.75 kg/m   Physical Exam Vitals and nursing note reviewed.  HENT:     Head: Normocephalic and atraumatic.     Mouth/Throat:     Mouth: Mucous membranes are moist.  Eyes:     General:        Right eye: No discharge.        Left eye: No discharge.     Conjunctiva/sclera: Conjunctivae normal.  Cardiovascular:     Rate and Rhythm: Normal rate and regular rhythm.     Pulses: Normal pulses.     Heart sounds: Normal heart sounds.  Pulmonary:     Effort: Pulmonary effort is normal.  Breath sounds: Normal breath sounds.  Abdominal:     General: Abdomen is flat. There is no distension.     Palpations: Abdomen is soft.     Tenderness: There is no abdominal tenderness.  Skin:    General: Skin is warm and dry.  Neurological:     General: No focal deficit present.     Comments: GCS 15. Speech is goal oriented. No deficits appreciated to CN III-XII; symmetric eyebrow raise, no facial drooping, tongue midline. Patient has equal grip strength bilaterally with 5/5 strength against resistance in all major muscle groups bilaterally. Sensation to light touch intact. Patient moves extremities without ataxia. Normal finger-nose-finger. Patient ambulatory with steady gait.  Psychiatric:        Mood and Affect: Mood normal.     (all labs ordered are listed, but only abnormal results are  displayed) Labs Reviewed  COMPREHENSIVE METABOLIC PANEL WITH GFR - Abnormal; Notable for the following components:      Result Value   Glucose, Bld 101 (*)    All other components within normal limits  CBC - Abnormal; Notable for the following components:   RBC 4.00 (*)    Platelets 135 (*)    All other components within normal limits  URINALYSIS, W/ REFLEX TO CULTURE (INFECTION SUSPECTED) - Abnormal; Notable for the following components:   APPearance HAZY (*)    Leukocytes,Ua LARGE (*)    Bacteria, UA RARE (*)    All other components within normal limits  URINE CULTURE  LIPASE, BLOOD    EKG: None  Radiology: MR Brain Wo Contrast (neuro protocol) Result Date: 07/25/2024 EXAM: MRI BRAIN WITHOUT CONTRAST 07/25/2024 10:22:33 AM TECHNIQUE: Multiplanar multisequence MRI of the head/brain was performed without the administration of intravenous contrast. COMPARISON: CT head without contrast 07/25/2024. MRI head without contrast 05/22/2023. CLINICAL HISTORY: Neuro deficit, acute, stroke suspected. FINDINGS: BRAIN AND VENTRICLES: No acute infarct. No intracranial hemorrhage. No mass. No midline shift. No hydrocephalus. Moderate atrophy and confluent white matter changes are stable. This most likely reflects the sequelae of chronic microvascular ischemia. A remote lacunar infarct of the left thalamus is again noted. Old lacunar infarcts are present in the right paramedian pons. The sella is unremarkable. Normal flow voids. ORBITS: No acute abnormality. SINUSES AND MASTOIDS: Bilateral maxillary antrostomies and partial ethmoidectomies are noted. BONES AND SOFT TISSUES: Normal marrow signal. No acute soft tissue abnormality. IMPRESSION: 1. No acute intracranial abnormality. 2. Stable moderate atrophy and confluent white matter changes, likely sequelae of chronic microvascular ischemia. 3. Stable infarct of the left thalamus. 4. Lacunar infarcts in the right paramedian pons. Electronically signed by:  Lonni Necessary MD 07/25/2024 11:11 AM EDT RP Workstation: [id number]   DG Chest Port 1 View Result Date: 07/25/2024 CLINICAL DATA:  Dizziness with nausea and vomiting. Elevated blood pressure. EXAM: PORTABLE CHEST 1 VIEW COMPARISON:  Radiographs 05/03/2023 and 09/19/2021.  CT 05/22/2023. FINDINGS: 0838 hours. Lordotic positioning and lower lung volumes. The heart size and mediastinal contours are stable with mild cardiomegaly and aortic atherosclerosis. Mild atelectasis at the left lung base. No confluent airspace disease, edema, pneumothorax or significant pleural effusion identified. The bones appear unchanged, without acute findings. IMPRESSION: Low lung volumes with mild left basilar atelectasis. No acute cardiopulmonary process. Electronically Signed   By: Elsie Perone M.D.   On: 07/25/2024 09:16   CT Head Wo Contrast Result Date: 07/25/2024 EXAM: CT HEAD WITHOUT CONTRAST 07/25/2024 08:19:15 AM TECHNIQUE: CT of the head was performed without the administration of intravenous contrast.  Automated exposure control, iterative reconstruction, and/or weight based adjustment of the mA/kV was utilized to reduce the radiation dose to as low as reasonably achievable. COMPARISON: 05/22/2023 CLINICAL HISTORY: Neuro deficit, acute, stroke suspected. Patient complains of dizziness, nausea, and vomiting since 4:30 AM. Patient ambulated in the hall with a walker, reported feeling lightheaded but no more than his baseline. FINDINGS: BRAIN AND VENTRICLES: No acute hemorrhage. No evidence of acute infarct. No hydrocephalus. No extra-axial collection. No mass effect or midline shift. Hypoattenuating foci in the cerebral white matter, most likely representing chronic small vessel disease. Prominence of the sulci and ventricles compatible with brain atrophy. Remote lacunar infarct within the left thalamus. ORBITS: No acute abnormality. SINUSES: Mild mucosal thickening involving the right maxillary sinus. Status post  bilateral median antrectomys. Mucosal thickening of the ethmoid air cells noted. SOFT TISSUES AND SKULL: No acute soft tissue abnormality. No skull fracture. IMPRESSION: 1. No acute intracranial abnormality. 2. 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 3. Remote lacunar infarct within the left thalamus. Electronically signed by: Waddell Calk MD 07/25/2024 08:27 AM EDT RP Workstation: [id number]     Procedures   Medications Ordered in the ED  apixaban (ELIQUIS) tablet 2.5 mg (has no administration in time range)  diltiazem  (CARDIZEM ) tablet 30 mg (has no administration in time range)  meclizine (ANTIVERT) tablet 25 mg (25 mg Oral Given 07/25/24 1154)  sodium chloride  0.9 % bolus 500 mL (0 mLs Intravenous Stopped 07/25/24 1230)  cephALEXin (KEFLEX) capsule 500 mg (500 mg Oral Given 07/25/24 1249)                                    Medical Decision Making Amount and/or Complexity of Data Reviewed Labs: ordered. Radiology: ordered.  Risk Prescription drug management.   Initial Impression and Ddx 83 year old well-appearing male presenting for dizziness.  Exam unremarkable.  Also able to ambulate patient initially without symptoms up and down the hallway.  DDx includes stroke, orthostatic hypotension, electrolyte derangement, intra-abdominal infection, arrhythmia, other. Patient PMH that increases complexity of ED encounter:  history of small bowel obstruction, CAD, A-fib on Eliquis  Interpretation of Diagnostics - I independent reviewed and interpreted the labs as followed: pyuria, bacteruria  - I independently visualized the following imaging with scope of interpretation limited to determining acute life threatening conditions related to emergency care: MRI brain, which revealed multiple non acute strokes, see details above. Shared findings with patient. CXR non acute  - I personally reviewed and interpreted EKG which revealed sinus rhythm  Patient Reassessment and  Ultimate Disposition/Management Workup largely reassuring.  Did discuss MRI findings with Dr. Michaela.  Did not feel that symptoms today were related to the MRI findings and advise likely orthostatic.  Treated with fluids and meclizine.  On reassessment stated he was feeling much better.  Able to ambulate around the room without issue.  Urine also somewhat concerning for UTI patient denied urinary symptoms.  Went ahead and started him on Keflex.  Advised him to follow-up with his PCP.  Discussed return precautions.  Blood pressure slightly elevated before discharge but patient reported that he had missed his medications this morning.  Gave his amlodipine and also a dose of his Eliquis.  Discharged.  Patient management required discussion with the following services or consulting groups:  Neurology  Complexity of Problems Addressed Acute complicated illness or Injury  Additional Data Reviewed and Analyzed Further history obtained from:  Further history from spouse/family member and Past medical history and medications listed in the EMR  Patient Encounter Risk Assessment Consideration of hospitalization      Final diagnoses:  Dizziness    ED Discharge Orders          Ordered    cephALEXin (KEFLEX) 500 MG capsule  4 times daily        07/25/24 1237    meclizine (ANTIVERT) 12.5 MG tablet  3 times daily PRN        07/25/24 1237               Lang Norleen POUR, PA-C 07/25/24 1238    Lang Norleen POUR, PA-C 07/25/24 1253    Rogelia Jerilynn RAMAN, MD 07/29/24 1920

## 2024-07-25 NOTE — ED Notes (Signed)
 Pt ambulated in the hall with a walker, reported feeling lightheaded but no more than his baseline.

## 2024-07-25 NOTE — Discharge Instructions (Addendum)
 Evaluation today was overall reassuring.  As we discussed on your MRI today there was evidence of nonacute strokes.  Please follow-up with your primary care doctor.  Also your urine was concerning for UTI.  This could be contributing to your symptoms.  I sent Keflex which is an antibiotic for treatment.  Also sent meclizine to help with your dizziness as well.  If your symptoms return, you develop chest pain, visual disturbance, facial droop, slurred speech, weakness or numbness in your extremities or any other concerning symptom please return to the ED for further evaluation.

## 2024-07-25 NOTE — ED Notes (Signed)
 Pt returned from CT

## 2024-07-26 LAB — URINE CULTURE: Culture: <10000 — AB

## 2024-10-13 ENCOUNTER — Ambulatory Visit: Admitting: Nurse Practitioner

## 2024-10-13 ENCOUNTER — Ambulatory Visit: Admitting: Physician Assistant
